# Patient Record
Sex: Female | Born: 1980 | Race: Black or African American | Hispanic: No | Marital: Married | Smoking: Never smoker
Health system: Southern US, Community
[De-identification: ages and names within clinical notes are randomized; demographics above are authoritative.]

## PROBLEM LIST (undated history)

## (undated) DIAGNOSIS — F99 Mental disorder, not otherwise specified: Secondary | ICD-10-CM

## (undated) HISTORY — DX: Mental disorder, not otherwise specified: F99

---
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
## 2010-05-05 ENCOUNTER — Ambulatory Visit: Payer: Self-pay | Admitting: Internal Medicine

## 2010-05-05 DIAGNOSIS — R209 Unspecified disturbances of skin sensation: Secondary | ICD-10-CM

## 2010-05-07 LAB — CONVERTED CEMR LAB
AST: 27 units/L (ref 0–37)
Albumin: 4.2 g/dL (ref 3.5–5.2)
Alkaline Phosphatase: 54 units/L (ref 39–117)
Basophils Relative: 0.3 % (ref 0.0–3.0)
Calcium: 9.6 mg/dL (ref 8.4–10.5)
Folate: 14.4 ng/mL
GFR calc non Af Amer: 213.37 mL/min (ref >60)
HDL: 48.2 mg/dL (ref >39.00)
Hemoglobin, Urine: NEGATIVE
Hemoglobin: 14.3 g/dL (ref 12.0–15.0)
Ketones, ur: NEGATIVE mg/dL
Lymphocytes Relative: 50.4 % — ABNORMAL HIGH (ref 12.0–46.0)
Monocytes Relative: 8 % (ref 3.0–12.0)
Neutro Abs: 2.5 10*3/uL (ref 1.4–7.7)
RBC: 4.58 M/uL (ref 3.87–5.11)
Sodium: 140 meq/L (ref 135–145)
Specific Gravity, Urine: 1.02 (ref 1.000–1.030)
Total CHOL/HDL Ratio: 3
Total Protein: 7.6 g/dL (ref 6.0–8.3)
Urine Glucose: NEGATIVE mg/dL
Urobilinogen, UA: 0.2 (ref 0.0–1.0)
VLDL: 8.8 mg/dL (ref 0.0–40.0)
Vitamin B-12: 428 pg/mL (ref 211–911)

## 2010-05-17 ENCOUNTER — Encounter: Payer: Self-pay | Admitting: Internal Medicine
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
## 2012-05-24 ENCOUNTER — Emergency Department (HOSPITAL_COMMUNITY)
Admission: EM | Admit: 2012-05-24 | Discharge: 2012-05-24 | Disposition: A | Discharge status: Home / Self Care | Payer: Federal, State, Local not specified - PPO | Attending: Emergency Medicine | Admitting: Emergency Medicine

## 2012-05-24 ENCOUNTER — Emergency Department (HOSPITAL_COMMUNITY): Payer: Federal, State, Local not specified - PPO

## 2012-05-24 ENCOUNTER — Encounter (HOSPITAL_COMMUNITY): Payer: Self-pay | Admitting: Emergency Medicine

## 2012-05-24 DIAGNOSIS — R079 Chest pain, unspecified: Secondary | ICD-10-CM

## 2012-05-24 DIAGNOSIS — R000 Tachycardia, unspecified: Secondary | ICD-10-CM | POA: Insufficient documentation

## 2012-05-24 NOTE — ED Notes (Signed)
Patient states new onset of chest discomfort-comes and goes-no new meds, daily routine has not changed , does not know what's causing symptoms

## 2012-05-24 NOTE — ED Provider Notes (Signed)
History     CSN: [id number]  Arrival date & time 05/24/12  0910   First MD Initiated Contact with Patient 05/24/12 [phone]      Chief Complaint  Patient presents with  . Chest Pain    (Consider location/radiation/quality/duration/timing/severity/associated sxs/prior treatment) HPI Comments: Madison Chambers presents ambulatory for evaluation of chest pain.  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  She states the location was below the left breast radiating to the back.  She also had numbness and tingling leg.   She denies any previous similar episodes of pain.  She denies pain  Right now.  She denies SOB, cough, palpitations, leg swelling, calf pain, fever, NVD, GERD, heartburn, back pain, and trauma.  She also denies any  New stressors.  Patient is a 31 y.o. female presenting with chest pain. The history is provided by the patient. No language interpreter was used.  Chest Pain The chest pain began 6 - 12 hours ago (0500). Chest pain occurs intermittently. The chest pain is improving. At its most intense, the pain is at 7/10. The pain is currently at 0/10. The quality of the pain is described as dull and tightness. Pertinent negatives for primary symptoms include no fever, no fatigue, no syncope, no shortness of breath, no cough, no wheezing, no palpitations, no abdominal pain, no nausea, no vomiting and no altered mental status.  Pertinent negatives for associated symptoms include no claudication, no diaphoresis, no lower extremity edema, no near-syncope, no numbness, no orthopnea, no paroxysmal nocturnal dyspnea and no weakness. She tried nothing for the symptoms. Risk factors include no known risk factors.  Her family medical history is significant for hypertension in family.     History reviewed. No pertinent past medical history.  History reviewed. No pertinent past surgical history.  History reviewed. No pertinent family  history.  History  Substance Use Topics  . Smoking status: Never Smoker   . Smokeless tobacco: Not on file  . Alcohol Use: No    OB History    Grav Para Term Preterm Abortions TAB SAB Ect Mult Living                  Review of Systems  Constitutional: Negative for fever, diaphoresis and fatigue.  HENT: Negative.   Eyes: Negative.   Respiratory: Positive for chest tightness. Negative for cough, choking, shortness of breath and wheezing.   Cardiovascular: Positive for chest pain. Negative for palpitations, orthopnea, claudication, leg swelling, syncope and near-syncope.  Gastrointestinal: Negative for nausea, vomiting and abdominal pain.  Genitourinary: Negative.   Neurological: Negative for weakness and numbness.  Hematological: Negative.   Psychiatric/Behavioral: Negative.  Negative for altered mental status.    Allergies  Review of patient's allergies indicates no known allergies.  Home Medications  No current outpatient prescriptions on file.  BP 129/88  Pulse 110  Temp 98.3 F (36.8 C) (Oral)  Resp 13  Ht 5\' 2"  (1.575 m)  Wt 128 lb (58.06 kg)  BMI 23.41 kg/m2  SpO2 100%  LMP 05/04/2012  Physical Exam  Constitutional: She is oriented to person, place, and time. She appears well-developed and well-nourished. No distress. She is not intubated.  HENT:  Head: Normocephalic and atraumatic.  Right Ear: External ear normal.  Left Ear: External ear normal.  Nose: Nose normal.  Mouth/Throat: Oropharynx is clear and moist. No oropharyngeal exudate.  Eyes: Conjunctivae normal and EOM are normal. Pupils are equal, round,  and reactive to light. Right eye exhibits no discharge. Left eye exhibits discharge. No scleral icterus.  Neck: Normal range of motion. Neck supple. No JVD present. No tracheal deviation present. No thyromegaly present.  Cardiovascular: Regular rhythm, S1 normal, S2 normal, normal heart sounds, intact distal pulses and normal pulses.   No extrasystoles  are present. Tachycardia present.  PMI is not displaced.  Exam reveals no gallop and no decreased pulses.   No murmur heard. Pulmonary/Chest: Effort normal and breath sounds normal. No accessory muscle usage or stridor. No apnea, not tachypneic and not bradypneic. She is not intubated. No respiratory distress. She has no decreased breath sounds. She has no wheezes. She has no rales. She exhibits no tenderness.  Abdominal: Soft. Bowel sounds are normal. She exhibits no shifting dullness, no distension, no ascites, no pulsatile midline mass and no mass. There is no tenderness. There is no rebound and no guarding.  Musculoskeletal: Normal range of motion. She exhibits no edema and no tenderness.  Lymphadenopathy:    She has no cervical adenopathy.  Neurological: She is alert and oriented to person, place, and time. No cranial nerve deficit.  Skin: Skin is warm and dry. No rash noted. She is not diaphoretic. No erythema. No pallor.  Psychiatric: She has a normal mood and affect. Her behavior is normal.    ED Course  Procedures (including critical care time)   Labs Reviewed  D-DIMER, QUANTITATIVE   No results found.   No diagnosis found.   Date: 05/24/2012  Rate: 111  Rhythm: sinus tachycardia  QRS Axis: normal  Intervals: normal  ST/T Wave abnormalities: normal  Conduction Disutrbances:none  Narrative Interpretation: sinus tachycardia without evidence of acute ischemia  Old EKG Reviewed: none available      MDM  Pt presents for evaluation of chest pain.  She is currently pain free.  Note mildly elevated BP with some tachycardia., NAD.  Pt denies risk factors for early heart disease, tobacco, alcohol, or substance abuse, OCP use or hormone therapy, and recent travel or immobilization.  Plan routine, age-appropriate chest pain screening including u-preg, EKG, CXR, and ddimer.  Sill review results as available.  1300.  Pt stable, NAD.  She is currently pain free. Tachycardia has  resolved.  Repeat EKG performed at 1249 demonstrates a sinus rhythm at 78 bpm with nl axis and intervals.  No evidence of acute ischemia observed.  D-dimer is not elevated and pt is at low risk for thromboembolic event by Well's Criteria.  CXR is clear without cardiomegaly, effusion, ptx, or infiltrate.  Plan discharge home to f/u closely with her PMD.  Will also refer to a local cardiologist.     Tobin Chad, MD 05/24/12 1308

## 2012-05-24 NOTE — ED Notes (Signed)
Pt reports sharp chest pain just below left breast, radiating into back.  Pt denies N/V, SOB, or additional symptoms.  Pt states pain began around 5am.  Pt also reports pain to left calf.  Pt denies smoking or birth control use.

## 2012-10-25 ENCOUNTER — Other Ambulatory Visit: Payer: Self-pay | Admitting: Obstetrics and Gynecology

## 2012-10-25 DIAGNOSIS — Z803 Family history of malignant neoplasm of breast: Secondary | ICD-10-CM

## 2012-10-25 DIAGNOSIS — Z1231 Encounter for screening mammogram for malignant neoplasm of breast: Secondary | ICD-10-CM

## 2012-12-10 ENCOUNTER — Ambulatory Visit
Admission: RE | Admit: 2012-12-10 | Discharge: 2012-12-10 | Disposition: A | Discharge status: Home / Self Care | Payer: Federal, State, Local not specified - PPO | Source: Ambulatory Visit | Attending: Obstetrics and Gynecology | Admitting: Obstetrics and Gynecology

## 2012-12-10 DIAGNOSIS — Z1231 Encounter for screening mammogram for malignant neoplasm of breast: Secondary | ICD-10-CM

## 2012-12-10 DIAGNOSIS — Z803 Family history of malignant neoplasm of breast: Secondary | ICD-10-CM

## 2013-03-08 ENCOUNTER — Emergency Department (INDEPENDENT_AMBULATORY_CARE_PROVIDER_SITE_OTHER)
Admission: EM | Admit: 2013-03-08 | Discharge: 2013-03-08 | Disposition: A | Discharge status: Home / Self Care | Payer: Self-pay | Source: Home / Self Care | Attending: Emergency Medicine | Admitting: Emergency Medicine

## 2013-03-08 ENCOUNTER — Encounter (HOSPITAL_COMMUNITY): Payer: Self-pay | Admitting: *Deleted

## 2013-03-08 DIAGNOSIS — S40811A Abrasion of right upper arm, initial encounter: Secondary | ICD-10-CM

## 2013-03-08 DIAGNOSIS — IMO0002 Reserved for concepts with insufficient information to code with codable children: Secondary | ICD-10-CM
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
## 2013-03-08 NOTE — ED Provider Notes (Signed)
Medical screening examination/treatment/procedure(s) were performed by non-physician practitioner and as supervising physician I was immediately available for consultation/collaboration.  Raynald Blend, MD 03/08/13 1413
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
## 2013-03-09 ENCOUNTER — Encounter (HOSPITAL_COMMUNITY): Payer: Self-pay | Admitting: Family Medicine

## 2013-03-09 ENCOUNTER — Emergency Department (HOSPITAL_COMMUNITY)
Admission: EM | Admit: 2013-03-09 | Discharge: 2013-03-09 | Disposition: A | Discharge status: Home / Self Care | Payer: No Typology Code available for payment source | Attending: Emergency Medicine | Admitting: Emergency Medicine

## 2013-03-09 ENCOUNTER — Emergency Department (HOSPITAL_COMMUNITY): Payer: No Typology Code available for payment source

## 2013-03-09 DIAGNOSIS — Z91040 Latex allergy status: Secondary | ICD-10-CM | POA: Insufficient documentation

## 2013-03-09 DIAGNOSIS — Y9389 Activity, other specified: Secondary | ICD-10-CM | POA: Insufficient documentation

## 2013-03-09 DIAGNOSIS — Y92410 Unspecified street and highway as the place of occurrence of the external cause: Secondary | ICD-10-CM | POA: Insufficient documentation

## 2013-03-09 DIAGNOSIS — S139XXA Sprain of joints and ligaments of unspecified parts of neck, initial encounter: Secondary | ICD-10-CM | POA: Insufficient documentation

## 2013-03-09 DIAGNOSIS — Z79899 Other long term (current) drug therapy: Secondary | ICD-10-CM | POA: Insufficient documentation

## 2013-03-09 DIAGNOSIS — S161XXA Strain of muscle, fascia and tendon at neck level, initial encounter: Secondary | ICD-10-CM

## 2013-03-09 MED ORDER — HYDROCODONE-ACETAMINOPHEN 5-325 MG PO TABS: 1.0000 | ORAL_TABLET | Freq: Four times a day (QID) | ORAL | Status: DC | PRN

## 2013-03-09 MED ORDER — IBUPROFEN 800 MG PO TABS: 800.0000 mg | ORAL_TABLET | Freq: Three times a day (TID) | ORAL | Status: DC | PRN

## 2013-03-09 MED ORDER — METHOCARBAMOL 750 MG PO TABS: 750.0000 mg | ORAL_TABLET | Freq: Four times a day (QID) | ORAL | Status: DC | PRN
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
## 2013-03-12 NOTE — ED Provider Notes (Signed)
  Medical screening examination/treatment/procedure(s) were performed by non-physician practitioner and as supervising physician I was immediately available for consultation/collaboration.    Gerhard Munch, MD 03/12/13 1114

## 2020-04-06 LAB — OB RESULTS CONSOLE ANTIBODY SCREEN: Antibody Screen: NEGATIVE

## 2020-04-06 LAB — OB RESULTS CONSOLE HGB/HCT, BLOOD
HCT: 37 (ref 29–41)
Hemoglobin: 12.6

## 2020-04-06 LAB — HEPATITIS C ANTIBODY: HCV Ab: NEGATIVE

## 2020-04-06 LAB — OB RESULTS CONSOLE PLATELET COUNT: Platelets: 182

## 2020-04-06 LAB — OB RESULTS CONSOLE ABO/RH: RH Type: NEGATIVE

## 2020-04-06 LAB — OB RESULTS CONSOLE RUBELLA ANTIBODY, IGM: Rubella: IMMUNE

## 2020-04-06 LAB — OB RESULTS CONSOLE RPR: RPR: NONREACTIVE

## 2020-04-06 LAB — OB RESULTS CONSOLE HEPATITIS B SURFACE ANTIGEN: Hepatitis B Surface Ag: NEGATIVE

## 2020-04-06 LAB — OB RESULTS CONSOLE VARICELLA ZOSTER ANTIBODY, IGG: Varicella: IMMUNE

## 2020-04-06 LAB — OB RESULTS CONSOLE GC/CHLAMYDIA
Chlamydia: NEGATIVE
Gonorrhea: NEGATIVE

## 2020-04-06 LAB — OB RESULTS CONSOLE HIV ANTIBODY (ROUTINE TESTING): HIV: NONREACTIVE

## 2020-06-22 ENCOUNTER — Encounter: Payer: Self-pay | Admitting: General Practice

## 2020-06-22 ENCOUNTER — Inpatient Hospital Stay (HOSPITAL_COMMUNITY): Admit: 2020-06-22 | Payer: No Typology Code available for payment source | Admitting: Obstetrics and Gynecology

## 2020-07-05 ENCOUNTER — Other Ambulatory Visit: Payer: Self-pay | Admitting: *Deleted

## 2020-07-05 ENCOUNTER — Encounter: Payer: Self-pay | Admitting: *Deleted

## 2020-07-05 DIAGNOSIS — O0990 Supervision of high risk pregnancy, unspecified, unspecified trimester: Secondary | ICD-10-CM | POA: Insufficient documentation

## 2020-07-05 DIAGNOSIS — O09529 Supervision of elderly multigravida, unspecified trimester: Secondary | ICD-10-CM

## 2020-07-05 HISTORY — DX: Supervision of elderly multigravida, unspecified trimester: O09.529

## 2020-07-05 HISTORY — DX: Supervision of high risk pregnancy, unspecified, unspecified trimester: O09.90

## 2020-07-15 ENCOUNTER — Other Ambulatory Visit: Payer: Self-pay

## 2020-07-15 ENCOUNTER — Encounter: Payer: Self-pay | Admitting: Obstetrics and Gynecology

## 2020-07-15 ENCOUNTER — Ambulatory Visit (INDEPENDENT_AMBULATORY_CARE_PROVIDER_SITE_OTHER): Payer: No Typology Code available for payment source | Admitting: Obstetrics and Gynecology

## 2020-07-15 VITALS — BP 117/76 | HR 94 | Wt 150.0 lb

## 2020-07-15 DIAGNOSIS — O0990 Supervision of high risk pregnancy, unspecified, unspecified trimester: Secondary | ICD-10-CM

## 2020-07-15 DIAGNOSIS — O09529 Supervision of elderly multigravida, unspecified trimester: Secondary | ICD-10-CM | POA: Diagnosis not present

## 2020-07-15 DIAGNOSIS — Z23 Encounter for immunization: Secondary | ICD-10-CM

## 2020-07-15 DIAGNOSIS — Z3A26 26 weeks gestation of pregnancy: Secondary | ICD-10-CM

## 2020-07-15 NOTE — Progress Notes (Signed)
INITIAL OBSTETRICAL VISIT Patient name: Madison Chambers MRN [id number]  Date of birth: 02-10-1981 Chief Complaint:   Routine Prenatal Visit  History of Present Illness:   Madison Chambers is a 39 y.o. G2P1001 African American female at [redacted]w[redacted]d by LMP with an Estimated Date of Delivery: 10/21/20 being seen today for her initial obstetrical visit. She is transferring her care from [facility] for waterbirth. Her obstetrical history is significant for advanced maternal age and Rh Negative. Her last pregnancy was uncomplicated resulting in a NSVD of 6 lbs 12 oz female (09/11/2008 at [facility] - IV pain meds only). This is an unplanned, but desired pregnancy. She and the father of the baby (FOB) "Madison Chambers" live together. She has a support system that consists of her husband/family/friends. Today she reports no complaints. She is   Patient's last menstrual period was 01/21/2020. Last pap 01/31/2019. Results were: normal Review of Systems:   Pertinent items are noted in HPI Denies cramping/contractions, leakage of fluid, vaginal bleeding, abnormal vaginal discharge w/ itching/odor/irritation, headaches, visual changes, shortness of breath, chest pain, abdominal pain, severe nausea/vomiting, or problems with urination or bowel movements unless otherwise stated above.  Pertinent History Reviewed:  Reviewed past medical,surgical, social, obstetrical and family history.  Reviewed problem list, medications and allergies. OB History  Gravida Para Term Preterm AB Living  2 1 1     1   SAB TAB Ectopic Multiple Live Births          1    # Outcome Date GA Lbr Len/2nd Weight Sex Delivery Anes PTL Lv  2 Current           1 Term 09/11/08 [redacted]w[redacted]d  6 lb 12 oz (3.062 kg) M Vag-Spont None  LIV   Physical Assessment:   Vitals:   07/15/20 1511  BP: 117/76  Pulse: 94  Weight: 150 lb (68 kg)  Body mass index is 27.44 kg/m.       Physical Examination:  General appearance - well  appearing, and in no distress  Mental status - alert, oriented to person, place, and time  Psych:  She has a normal mood and affect  Skin - warm and dry, normal color, no suspicious lesions noted  Chest - effort normal, all lung fields clear to auscultation bilaterally  Heart - normal rate and regular rhythm  Abdomen - soft, nontender  Extremities:  No swelling or varicosities noted  Pelvic - deferred  Thin prep pap is not done    FHTs by doppler: 149 bpm  Assessment & Plan:  1) High-Risk Pregnancy G2P1001 at [redacted]w[redacted]d with an Estimated Date of Delivery: 10/21/20   2) Initial OB visit - Welcomed to practice and introduced self to patient in addition to discussing other advanced practice providers that she may be seeing at this practice - Congratulated patient - Anticipatory guidance on upcoming appointments - Educated on COVID19 and pregnancy and the integration of virtual appointments  - Educated on babyscripts app- patient reports she has not received email, encouraged to look in spam folder and to call office if she still has not received email - patient verbalizes understanding    3) Supervision of high risk pregnancy, antepartum - Tdap vaccine greater than or equal to 7yo IM - Discussed need for Rhogam at 28 wks, plan to offer TdaP and flu vaccine  4) Antepartum multigravida of advanced maternal age - Antenatal testing according to MFM guidelines  5) [redacted] weeks gestation of pregnancy   Meds: No  orders of the defined types were placed in this encounter.   Initial labs reviewed from prior OB office records Continue prenatal vitamins Reviewed n/v relief measures and warning s/s to report Reviewed recommended weight gain based on pre-gravid BMI Encouraged well-balanced diet Genetic Screening discussed: results reviewed Cystic fibrosis, SMA, Fragile X screening discussed results reviewed The nature of [facility] with multiple MDs and other  Advanced Practice Providers was explained to patient; also emphasized that residents, students are part of our team.  Discussed optimized OB schedule and video visits. Advised can have an in-office visit whenever she feels she needs to be seen.  Does not have own BP cuff. Check BP weekly, let us know if >140/90. Advised to call during normal business hours and there is an after-hours nurse line available.    Follow-up: Return in about 3 weeks (around 08/05/2020) for Return OB 2hr GTT.   Orders Placed This Encounter  Procedures  . Tdap vaccine greater than or equal to 7yo IM  . OB RESULTS CONSOLE GC/Chlamydia  . OB RESULTS CONSOLE RPR  . OB RESULTS CONSOLE HIV antibody  . OB RESULTS CONSOLE Rubella Antibody  . OB RESULTS CONSOLE Varicella zoster antibody, IgG  . OB RESULTS CONSOLE Hepatitis B surface antigen  . OB RESULTS CONSOLE Hemoglobin and hematocrit, blood  . OB RESULTS CONSOLE PLATELET COUNT  . Hepatitis C antibody  . OB RESULTS CONSOLE ABO/Rh  . OB RESULTS CONSOLE Antibody Screen    Raelyn Mora MSN, CNM 07/16/2020

## 2020-07-15 NOTE — Patient Instructions (Signed)
Glucose Tolerance Test During Pregnancy Why am I having this test? The glucose tolerance test (GTT) is done to check how your body processes sugar (glucose). This is one of several tests used to diagnose diabetes that develops during pregnancy (gestational diabetes mellitus). Gestational diabetes is a temporary form of diabetes that some women develop during pregnancy. It usually occurs during the second trimester of pregnancy and goes away after delivery. Testing (screening) for gestational diabetes usually occurs between 24 and 28 weeks of pregnancy. You may have the GTT test after having a 1-hour glucose screening test if the results from that test indicate that you may have gestational diabetes. You may also have this test if:  You have a history of gestational diabetes.  You have a history of giving birth to very large babies or have experienced repeated fetal loss (stillbirth).  You have signs and symptoms of diabetes, such as: ? Changes in your vision. ? Tingling or numbness in your hands or feet. ? Changes in hunger, thirst, and urination that are not otherwise explained by your pregnancy. What is being tested? This test measures the amount of glucose in your blood at different times during a period of 3 hours. This indicates how well your body is able to process glucose. What kind of sample is taken?  Blood samples are required for this test. They are usually collected by inserting a needle into a blood vessel. How do I prepare for this test?  For 3 days before your test, eat normally. Have plenty of carbohydrate-rich foods.  Follow instructions from your health care provider about: ? Eating or drinking restrictions on the day of the test. You may be asked to not eat or drink anything other than water (fast) starting 8-10 hours before the test. ? Changing or stopping your regular medicines. Some medicines may interfere with this test. Tell a health care provider about:  All  medicines you are taking, including vitamins, herbs, eye drops, creams, and over-the-counter medicines.  Any blood disorders you have.  Any surgeries you have had.  Any medical conditions you have. What happens during the test? First, your blood glucose will be measured. This is referred to as your fasting blood glucose, since you fasted before the test. Then, you will drink a glucose solution that contains a certain amount of glucose. Your blood glucose will be measured again 1, 2, and 3 hours after drinking the solution. This test takes about 3 hours to complete. You will need to stay at the testing location during this time. During the testing period:  Do not eat or drink anything other than the glucose solution.  Do not exercise.  Do not use any products that contain nicotine or tobacco, such as cigarettes and e-cigarettes. If you need help stopping, ask your health care provider. The testing procedure may vary among health care providers and hospitals. How are the results reported? Your results will be reported as milligrams of glucose per deciliter of blood (mg/dL) or millimoles per liter (mmol/L). Your health care provider will compare your results to normal ranges that were established after testing a large group of people (reference ranges). Reference ranges may vary among labs and hospitals. For this test, common reference ranges are:  Fasting: less than 95-105 mg/dL (5.3-5.8 mmol/L).  1 hour after drinking glucose: less than 180-190 mg/dL (10.0-10.5 mmol/L).  2 hours after drinking glucose: less than 155-165 mg/dL (8.6-9.2 mmol/L).  3 hours after drinking glucose: 140-145 mg/dL (7.8-8.1 mmol/L). What do the   results mean? Results within reference ranges are considered normal, meaning that your glucose levels are well-controlled. If two or more of your blood glucose levels are high, you may be diagnosed with gestational diabetes. If only one level is high, your health care  provider may suggest repeat testing or other tests to confirm a diagnosis. Talk with your health care provider about what your results mean. Questions to ask your health care provider Ask your health care provider, or the department that is doing the test:  When will my results be ready?  How will I get my results?  What are my treatment options?  What other tests do I need?  What are my next steps? Summary  The glucose tolerance test (GTT) is one of several tests used to diagnose diabetes that develops during pregnancy (gestational diabetes mellitus). Gestational diabetes is a temporary form of diabetes that some women develop during pregnancy.  You may have the GTT test after having a 1-hour glucose screening test if the results from that test indicate that you may have gestational diabetes. You may also have this test if you have any symptoms or risk factors for gestational diabetes.  Talk with your health care provider about what your results mean. This information is not intended to replace advice given to you by your health care provider. Make sure you discuss any questions you have with your health care provider. Document Revised: 12/05/2018 Document Reviewed: 03/26/2017 Elsevier Patient Education  2020 [employer]vier Inc[employer]  

## 2020-07-16 ENCOUNTER — Encounter: Payer: Self-pay | Admitting: Obstetrics and Gynecology

## 2020-08-04 ENCOUNTER — Other Ambulatory Visit: Payer: Self-pay

## 2020-08-04 ENCOUNTER — Ambulatory Visit (INDEPENDENT_AMBULATORY_CARE_PROVIDER_SITE_OTHER): Payer: No Typology Code available for payment source

## 2020-08-04 VITALS — BP 119/69 | HR 102 | Temp 97.9°F | Wt 152.6 lb

## 2020-08-04 DIAGNOSIS — Z2913 Encounter for prophylactic Rho(D) immune globulin: Secondary | ICD-10-CM | POA: Diagnosis not present

## 2020-08-04 DIAGNOSIS — O0990 Supervision of high risk pregnancy, unspecified, unspecified trimester: Secondary | ICD-10-CM | POA: Diagnosis not present

## 2020-08-04 DIAGNOSIS — Z3A28 28 weeks gestation of pregnancy: Secondary | ICD-10-CM

## 2020-08-04 MED ORDER — RHO D IMMUNE GLOBULIN 1500 UNIT/2ML IJ SOSY
300.0000 ug | PREFILLED_SYRINGE | Freq: Once | INTRAMUSCULAR | Status: AC
  Administered 2020-08-04: 300 ug via INTRAMUSCULAR

## 2020-08-04 NOTE — Patient Instructions (Signed)
Glucose Tolerance Test During Pregnancy Why am I having this test? The glucose tolerance test (GTT) is done to check how your body processes sugar (glucose). This is one of several tests used to diagnose diabetes that develops during pregnancy (gestational diabetes mellitus). Gestational diabetes is a temporary form of diabetes that some women develop during pregnancy. It usually occurs during the second trimester of pregnancy and goes away after delivery. Testing (screening) for gestational diabetes usually occurs between 24 and 28 weeks of pregnancy. You may have the GTT test after having a 1-hour glucose screening test if the results from that test indicate that you may have gestational diabetes. You may also have this test if:  You have a history of gestational diabetes.  You have a history of giving birth to very large babies or have experienced repeated fetal loss (stillbirth).  You have signs and symptoms of diabetes, such as: ? Changes in your vision. ? Tingling or numbness in your hands or feet. ? Changes in hunger, thirst, and urination that are not otherwise explained by your pregnancy. What is being tested? This test measures the amount of glucose in your blood at different times during a period of 3 hours. This indicates how well your body is able to process glucose. What kind of sample is taken?  Blood samples are required for this test. They are usually collected by inserting a needle into a blood vessel. How do I prepare for this test?  For 3 days before your test, eat normally. Have plenty of carbohydrate-rich foods.  Follow instructions from your health care provider about: ? Eating or drinking restrictions on the day of the test. You may be asked to not eat or drink anything other than water (fast) starting 8-10 hours before the test. ? Changing or stopping your regular medicines. Some medicines may interfere with this test. Tell a health care provider about:  All  medicines you are taking, including vitamins, herbs, eye drops, creams, and over-the-counter medicines.  Any blood disorders you have.  Any surgeries you have had.  Any medical conditions you have. What happens during the test? First, your blood glucose will be measured. This is referred to as your fasting blood glucose, since you fasted before the test. Then, you will drink a glucose solution that contains a certain amount of glucose. Your blood glucose will be measured again 1, 2, and 3 hours after drinking the solution. This test takes about 3 hours to complete. You will need to stay at the testing location during this time. During the testing period:  Do not eat or drink anything other than the glucose solution.  Do not exercise.  Do not use any products that contain nicotine or tobacco, such as cigarettes and e-cigarettes. If you need help stopping, ask your health care provider. The testing procedure may vary among health care providers and hospitals. How are the results reported? Your results will be reported as milligrams of glucose per deciliter of blood (mg/dL) or millimoles per liter (mmol/L). Your health care provider will compare your results to normal ranges that were established after testing a large group of people (reference ranges). Reference ranges may vary among labs and hospitals. For this test, common reference ranges are:  Fasting: less than 95-105 mg/dL (5.3-5.8 mmol/L).  1 hour after drinking glucose: less than 180-190 mg/dL (10.0-10.5 mmol/L).  2 hours after drinking glucose: less than 155-165 mg/dL (8.6-9.2 mmol/L).  3 hours after drinking glucose: 140-145 mg/dL (7.8-8.1 mmol/L). What do the   results mean? Results within reference ranges are considered normal, meaning that your glucose levels are well-controlled. If two or more of your blood glucose levels are high, you may be diagnosed with gestational diabetes. If only one level is high, your health care  provider may suggest repeat testing or other tests to confirm a diagnosis. Talk with your health care provider about what your results mean. Questions to ask your health care provider Ask your health care provider, or the department that is doing the test:  When will my results be ready?  How will I get my results?  What are my treatment options?  What other tests do I need?  What are my next steps? Summary  The glucose tolerance test (GTT) is one of several tests used to diagnose diabetes that develops during pregnancy (gestational diabetes mellitus). Gestational diabetes is a temporary form of diabetes that some women develop during pregnancy.  You may have the GTT test after having a 1-hour glucose screening test if the results from that test indicate that you may have gestational diabetes. You may also have this test if you have any symptoms or risk factors for gestational diabetes.  Talk with your health care provider about what your results mean. This information is not intended to replace advice given to you by your health care provider. Make sure you discuss any questions you have with your health care provider. Document Revised: 12/05/2018 Document Reviewed: 03/26/2017 Elsevier Patient Education  2020 [employer]vier Inc[employer]  

## 2020-08-04 NOTE — Progress Notes (Signed)
   HIGH-RISK PREGNANCY OFFICE VISIT  Patient name: Madison Chambers MRN [id number]  Date of birth: [birth year] Chief Complaint:   Routine Prenatal Visit  Subjective:   Madison Chambers is a 39 y.o. G2P1001 female at [redacted]w[redacted]d with an Estimated Date of Delivery: 10/21/20 being seen today for ongoing management of a high-risk pregnancy aeb has NUMBNESS, HAND; Supervision of high risk pregnancy, antepartum; and Antepartum multigravida of advanced maternal age on their problem list.  Patient presents today without complaints. Patient endorses fetal movement.  She denies abdominal pain, contractions, and vaginal concerns including abnormal discharge, leaking of fluid, and bleeding.  Contractions: Not present. Vag. Bleeding: None.  Movement: Present.  Reviewed past medical,surgical, social, obstetrical and family history as well as problem list, medications and allergies.  Objective   Vitals:   08/04/20 0831  BP: 119/69  Pulse: (!) 102  Temp: 97.9 F (36.6 C)  Weight: 152 lb 9.6 oz (69.2 kg)  Body mass index is 27.91 kg/m.  Total Weight Gain:27 lb 9.6 oz (12.5 kg)         Physical Examination:   General appearance: Well appearing, and in no distress  Mental status: Alert, oriented to person, place, and time  Skin: Warm & dry  Cardiovascular: Normal heart rate noted  Respiratory: Normal respiratory effort, no distress  Abdomen: Soft, gravid, nontender, AGA with Fundal height of Fundal Height: 26 cm  Pelvic: Cervical exam deferred           Extremities: Edema: None  Fetal Status: Fetal Heart Rate (bpm): 141  Movement: Present   No results found for this or any previous visit (from the past 24 hour(s)).  Assessment & Plan:  HIGH-risk pregnancy of a 39 y.o., G2P1001 at [redacted]w[redacted]d with an Estimated Date of Delivery: 10/21/20   1. Supervision of high risk pregnancy, antepartum -Anticipatory guidance for upcoming appts. -Reviewed glucola today and discussed how results of GTT are handled  including diabetic education. -BS testing for abnormal results and routine care for normal results.   2. Need for rhogam due to Rh negative mother -O Negative -Will give rhogam today  3. [redacted] weeks gestation of pregnancy -Doing well. -Desires WB. -Reports took class and instructed to send certificate via mychart. -Also taking breastfeeding course. -Reviewed fundal height. Reassured that okay to be +/- 2 FB, but anymore would require further evaluation.    Meds:  Meds ordered this encounter  Medications  . rho (d) immune globulin (RHIG/RHOPHYLAC) injection 300 mcg   Labs/procedures today:  Lab Orders     Glucose Tolerance, 2 Hours w/1 Hour     HIV Antibody (routine testing w rflx)     RPR     CBC   Reviewed: Preterm labor symptoms and general obstetric precautions including but not limited to vaginal bleeding, contractions, leaking of fluid and fetal movement were reviewed in detail with the patient.  All questions were answered.  Follow-up: Return in about 2 weeks (around 08/18/2020) for HROB.  Orders Placed This Encounter  Procedures  . Glucose Tolerance, 2 Hours w/1 Hour  . HIV Antibody (routine testing w rflx)  . RPR  . CBC   Cherre Robins MSN, CNM 08/04/2020

## 2020-08-05 ENCOUNTER — Encounter: Payer: Self-pay | Admitting: General Practice

## 2020-08-05 LAB — CBC
Hematocrit: 31.3 % — ABNORMAL LOW (ref 34.0–46.6)
Hemoglobin: 11.2 g/dL (ref 11.1–15.9)
MCH: 32 pg (ref 26.6–33.0)
MCHC: 35.8 g/dL — ABNORMAL HIGH (ref 31.5–35.7)
MCV: 89 fL (ref 79–97)
Platelets: 170 10*3/uL (ref 150–450)
RBC: 3.5 x10E6/uL — ABNORMAL LOW (ref 3.77–5.28)
RDW: 11.8 % (ref 11.7–15.4)
WBC: 7.2 10*3/uL (ref 3.4–10.8)

## 2020-08-05 LAB — RPR: RPR Ser Ql: NONREACTIVE

## 2020-08-05 LAB — GLUCOSE TOLERANCE, 2 HOURS W/ 1HR
Glucose, 1 hour: 164 mg/dL (ref 65–179)
Glucose, 2 hour: 143 mg/dL (ref 65–152)
Glucose, Fasting: 89 mg/dL (ref 65–91)

## 2020-08-05 LAB — HIV ANTIBODY (ROUTINE TESTING W REFLEX): HIV Screen 4th Generation wRfx: NONREACTIVE

## 2020-08-18 ENCOUNTER — Ambulatory Visit (INDEPENDENT_AMBULATORY_CARE_PROVIDER_SITE_OTHER): Payer: No Typology Code available for payment source | Admitting: Obstetrics and Gynecology

## 2020-08-18 ENCOUNTER — Other Ambulatory Visit: Payer: Self-pay

## 2020-08-18 VITALS — BP 111/71 | HR 94 | Temp 97.7°F | Wt 155.2 lb

## 2020-08-18 DIAGNOSIS — O0990 Supervision of high risk pregnancy, unspecified, unspecified trimester: Secondary | ICD-10-CM

## 2020-08-18 DIAGNOSIS — O09529 Supervision of elderly multigravida, unspecified trimester: Secondary | ICD-10-CM

## 2020-08-18 DIAGNOSIS — Z3A30 30 weeks gestation of pregnancy: Secondary | ICD-10-CM

## 2020-08-18 DIAGNOSIS — R55 Syncope and collapse: Secondary | ICD-10-CM

## 2020-08-18 NOTE — Patient Instructions (Signed)
Protein Content in Foods  Generally, most healthy people need around 50 grams of protein each day. Depending on your overall health, you may need more or less protein in your diet. Talk to your health care provider or dietitian about how much protein you need. See the following list for the protein content of some common foods. High-protein foods High-protein foods contain 4 grams (4 g) or more of protein per serving. They include:  Beef, ground sirloin (cooked) -- 3 oz have 24 g of protein.  Cheese (hard) -- 1 oz has 7 g of protein.  Chicken breast, boneless and skinless (cooked) -- 3 oz have 13.4 g of protein.  Cottage cheese -- 1/2 cup has 13.4 g of protein.  Egg -- 1 egg has 6 g of protein.  Fish, filet (cooked) -- 1 oz has 6-7 g of protein.  Garbanzo beans (canned or cooked) -- 1/2 cup has 6-7 g of protein.  Kidney beans (canned or cooked) -- 1/2 cup has 6-7 g of protein.  Lamb (cooked) -- 3 oz has 24 g of protein.  Milk -- 1 cup (8 oz) has 8 g of protein.  Nuts (peanuts, pistachios, almonds) -- 1 oz has 6 g of protein.  Peanut butter -- 1 oz has 7-8 g of protein.  Pork tenderloin (cooked) -- 3 oz has 18.4 g of protein.  Pumpkin seeds -- 1 oz has 8.5 g of protein.  Soybeans (roasted) -- 1 oz has 8 g of protein.  Soybeans (cooked) -- 1/2 cup has 11 g of protein.  Soy milk -- 1 cup (8 oz) has 5-10 g of protein.  Soy or vegetable patty -- 1 patty has 11 g of protein.  Sunflower seeds -- 1 oz has 5.5 g of protein.  Tofu (firm) -- 1/2 cup has 20 g of protein.  Tuna (canned in water) -- 3 oz has 20 g of protein.  Yogurt -- 6 oz has 8 g of protein. Low-protein foods Low-protein foods contain 3 grams (3 g) or less of protein per serving. They include:  Beets (raw or cooked) -- 1/2 cup has 1.5 g of protein.  Bran cereal -- 1/2 cup has 2-3 g of protein.  Bread -- 1 slice has 2.5 g of protein.  Broccoli (raw or cooked) -- 1/2 cup has 2 g of protein.  Collard  greens (raw or cooked) -- 1/2 cup has 2 g of protein.  Corn (fresh or cooked) -- 1/2 cup has 2 g of protein.  Cream cheese -- 1 oz has 2 g of protein.  Creamer (half-and-half) -- 1 oz has 1 g of protein.  Flour tortilla -- 1 tortilla has 2.5 g of protein  Frozen yogurt -- 1/2 cup has 3 g of protein.  Fruit or vegetable juice -- 1/2 cup has 1 g of protein.  Green beans (raw or cooked) -- 1/2 cup has 1 g of protein.  Green peas (canned) -- 1/2 cup has 3.5 g of protein.  Muffins -- 1 small muffin (2 oz) has 3 g of protein.  Oatmeal (cooked) -- 1/2 cup has 3 g of protein.  Potato (baked with skin) -- 1 medium potato has 3 g of protein.  Rice (cooked) -- 1/2 cup has 2.5-3.5 g of protein.  Sour cream -- 1/2 cup has 2.5 g of protein.  Spinach (cooked) -- 1/2 cup has 3 g of protein.  Squash (cooked) -- 1/2 cup has 1.5 g of protein. Actual amounts of protein may   be different depending on processing. Talk with your health care provider or dietitian about what foods are recommended for you. This information is not intended to replace advice given to you by your health care provider. Make sure you discuss any questions you have with your health care provider. Document Revised: 04/24/2016 Document Reviewed: 04/24/2016 Elsevier Patient Education  2020 Elsevier Inc. High-Protein and High-Calorie Diet Eating high-protein and high-calorie foods can help you to gain weight, heal after an injury, and recover after an illness or surgery. The specific amount of daily protein and calories you need depends on:  Your body weight.  The reason this diet is recommended for you. What is my plan? Generally, a high-protein, high-calorie diet involves:  Eating 250-500 extra calories each day.  Making sure that you get enough of your daily calories from protein. Ask your health care provider how many of your calories should come from protein. Talk with a health care provider, such as a diet and  nutrition specialist (dietitian), about how much protein and how many calories you need each day. Follow the diet as directed by your health care provider. What are tips for following this plan?  Preparing meals  Add whole milk, half-and-half, or heavy cream to cereal, pudding, soup, or hot cocoa.  Add whole milk to instant breakfast drinks.  Add peanut butter to oatmeal or smoothies.  Add powdered milk to baked goods, smoothies, or milkshakes.  Add powdered milk, cream, or butter to mashed potatoes.  Add cheese to cooked vegetables.  Make whole-milk yogurt parfaits. Top them with granola, fruit, or nuts.  Add cottage cheese to your fruit.  Add avocado, cheese, or both to sandwiches or salads.  Add meat, poultry, or seafood to rice, pasta, casseroles, salads, and soups.  Use mayonnaise when making egg salad, chicken salad, or tuna salad.  Use peanut butter as a dip for vegetables or as a topping for pretzels, celery, or crackers.  Add beans to casseroles, dips, and spreads.  Add pureed beans to sauces and soups.  Replace calorie-free drinks with calorie-containing drinks, such as milk and fruit juice.  Replace water with milk or heavy cream when making foods such as oatmeal, pudding, or cocoa. General instructions  Ask your health care provider if you should take a nutritional supplement.  Try to eat six small meals each day instead of three large meals.  Eat a balanced diet. In each meal, include one food that is high in protein.  Keep nutritious snacks available, such as nuts, trail mixes, dried fruit, and yogurt.  If you have kidney disease or diabetes, talk with your health care provider about how much protein is safe for you. Too much protein may put extra stress on your kidneys.  Drink your calories. Choose high-calorie drinks and have them after your meals. What high-protein foods should I eat?  Vegetables Soybeans. Peas. Grains Quinoa. Bulgur  wheat. Meats and other proteins Beef, pork, and poultry. Fish and seafood. Eggs. Tofu. Textured vegetable protein (TVP). Peanut butter. Nuts and seeds. Dried beans. Protein powders. Dairy Whole milk. Whole-milk yogurt. Powdered milk. Cheese. [employer]. Eggnog. Beverages High-protein supplement drinks. Soy milk. Other foods Protein bars. The items listed above may not be a complete list of high-protein foods and beverages. Contact a dietitian for more options. What high-calorie foods should I eat? Fruits Dried fruit. Fruit leather. Canned fruit in syrup. Fruit juice. Avocado. Vegetables Vegetables cooked in oil or butter. Fried potatoes. Grains Pasta. Quick breads. Muffins. Pancakes. Ready-to-eat  cereal. Meats and other proteins Peanut butter. Nuts and seeds. Dairy Heavy cream. Whipped cream. Cream cheese. Sour cream. Ice cream. Custard. Pudding. Beverages Meal-replacement beverages. Nutrition shakes. Fruit juice. Sugar-sweetened soft drinks. Seasonings and condiments Salad dressing. Mayonnaise. Alfredo sauce. Fruit preserves or jelly. Honey. Syrup. Sweets and desserts Cake. Cookies. Pie. Pastries. Candy bars. Chocolate. Fats and oils Butter or margarine. Oil. Gravy. Other foods Meal-replacement bars. The items listed above may not be a complete list of high-calorie foods and beverages. Contact a dietitian for more options. Summary  A high-protein, high-calorie diet can help you gain weight or heal faster after an injury, illness, or surgery.  To increase your protein and calories, add ingredients such as whole milk, peanut butter, cheese, beans, meat, or seafood to meal items.  To get enough extra calories each day, include high-calorie foods and beverages at each meal.  Adding a high-calorie drink or shake can be an easy way to help you get enough calories each day. Talk with your healthcare provider or dietitian about the best options for you. This information is not  intended to replace advice given to you by your health care provider. Make sure you discuss any questions you have with your health care provider. Document Revised: 07/27/2017 Document Reviewed: 06/26/2017 Elsevier Patient Education  2020 [employer].

## 2020-08-18 NOTE — Progress Notes (Signed)
   PRENATAL VISIT NOTE  Subjective:  Madison Chambers is a 39 y.o. G2P1001 at [redacted]w[redacted]d being seen today for ongoing prenatal care.  She is currently monitored for the following issues for this high-risk pregnancy and has NUMBNESS, HAND; Supervision of high risk pregnancy, antepartum; and Antepartum multigravida of advanced maternal age on their problem list.  Patient reports no bleeding, no contractions, no cramping, no leaking and near syncopal episode once a week, resolved by sitting or lying down. .  Contractions: Not present. Vag. Bleeding: None.  Movement: Present. Denies leaking of fluid.   The following portions of the patient's history were reviewed and updated as appropriate: allergies, current medications, past family history, past medical history, past social history, past surgical history and problem list.   Objective:   Vitals:   08/18/20 1415  BP: 111/71  Pulse: 94  Temp: 97.7 F (36.5 C)  Weight: 70.4 kg    Fetal Status: Fetal Heart Rate (bpm): 145 Fundal Height: 31 cm Movement: Present     General:  Alert, oriented and cooperative. Patient is in no acute distress.  Skin: Skin is warm and dry. No rash noted.   Cardiovascular: Normal heart rate noted  Respiratory: Normal respiratory effort, no problems with respiration noted  Abdomen: Soft, gravid, appropriate for gestational age.  Pain/Pressure: Absent     Pelvic: Cervical exam deferred        Extremities: Normal range of motion.  Edema: None  Mental Status: Normal mood and affect. Normal behavior. Normal judgment and thought content.   Assessment and Plan:  Pregnancy: G2P1001 at [redacted]w[redacted]d  1. Supervision of high risk pregnancy, antepartum   2. Antepartum multigravida of advanced maternal age   37. [redacted] weeks gestation of pregnancy   4. Near syncope -Encouraged patient to increase water intake - Take frequent breaks at work  Preterm labor symptoms and general obstetric precautions including but not limited to  vaginal bleeding, contractions, leaking of fluid and fetal movement were reviewed in detail with the patient. Please refer to After Visit Summary for other counseling recommendations.   Return in about 5 weeks (around 09/22/2020).  No future appointments.  Sande Rives, Student-MidWife

## 2020-08-28 NOTE — L&D Delivery Note (Signed)
Delivery Note Pt progressed quickly through active labor, unmedicated.  After a 10  Minute 2nd stage, at 9:20 PM a viable female was delivered via Vaginal, Spontaneous (Presentation: Left Occiput Anterior).  APGAR: 8, 9; weight  Pending. fter 1 minute, the cord was clamped and cut. 40 units of pitocin diluted in 1000cc LR was infused rapidly IV.  The placenta separated spontaneously and delivered via CCT and maternal pushing effort.  It was inspected and appears to be intact with a 3 VC.    Anesthesia: Local Episiotomy: None Lacerations: 2nd degree Suture Repair: 2.0 vicryl Est. Blood Loss (mL):  253  Mom to postpartum.  Baby to Couplet care / Skin to Skin.  Jacklyn Shell 10/21/2020, 10:28 PM

## 2020-09-20 ENCOUNTER — Encounter (HOSPITAL_COMMUNITY): Payer: Self-pay | Admitting: Obstetrics and Gynecology

## 2020-09-20 ENCOUNTER — Other Ambulatory Visit: Payer: Self-pay

## 2020-09-20 ENCOUNTER — Inpatient Hospital Stay (HOSPITAL_COMMUNITY)
Admission: AD | Admit: 2020-09-20 | Discharge: 2020-09-20 | Disposition: A | Discharge status: Home / Self Care | Payer: No Typology Code available for payment source | Attending: Obstetrics and Gynecology | Admitting: Obstetrics and Gynecology

## 2020-09-20 DIAGNOSIS — O479 False labor, unspecified: Secondary | ICD-10-CM

## 2020-09-20 DIAGNOSIS — R109 Unspecified abdominal pain: Secondary | ICD-10-CM | POA: Insufficient documentation

## 2020-09-20 DIAGNOSIS — Z3A35 35 weeks gestation of pregnancy: Secondary | ICD-10-CM | POA: Insufficient documentation

## 2020-09-20 DIAGNOSIS — O09523 Supervision of elderly multigravida, third trimester: Secondary | ICD-10-CM | POA: Diagnosis not present

## 2020-09-20 DIAGNOSIS — O4703 False labor before 37 completed weeks of gestation, third trimester: Secondary | ICD-10-CM | POA: Diagnosis not present

## 2020-09-20 DIAGNOSIS — O26893 Other specified pregnancy related conditions, third trimester: Secondary | ICD-10-CM | POA: Insufficient documentation

## 2020-09-20 DIAGNOSIS — R102 Pelvic and perineal pain: Secondary | ICD-10-CM | POA: Insufficient documentation

## 2020-09-20 DIAGNOSIS — Z3689 Encounter for other specified antenatal screening: Secondary | ICD-10-CM

## 2020-09-20 DIAGNOSIS — N949 Unspecified condition associated with female genital organs and menstrual cycle: Secondary | ICD-10-CM

## 2020-09-20 NOTE — MAU Provider Note (Signed)
Event Date/Time   First Provider Initiated Contact with Patient 09/20/20 2020     S [name] is a 40 y.o. G2P1001 pregnant female at [redacted]w[redacted]d who presents to MAU today with complaint of sharp intermittent abdominal pain since 9am this morning. She has not had much to eat as her appetite has been down today, but she has been able to stay hydrated. Denies nausea or vomiting. Yesterday evening, she tripped over the dishwasher door and fell to her knees but did not hit her belly. Denies decreased fetal movement, loss of fluid, vaginal bleeding or discharge, fever or recent illness.    O BP 124/79 (BP Location: Left Arm)   Pulse 87   Temp 98.1 F (36.7 C) (Oral)   Resp 16   Ht 5\' 2"  (1.575 m)   Wt 158 lb (71.7 kg)   LMP 01/21/2020   SpO2 99%   BMI 28.90 kg/m  Physical Exam Vitals and nursing note reviewed. Exam conducted with a chaperone present.  Constitutional:      General: She is not in acute distress.    Appearance: She is not ill-appearing.  Cardiovascular:     Rate and Rhythm: Normal rate.  Pulmonary:     Effort: Pulmonary effort is normal.  Abdominal:     General: Bowel sounds are normal.     Palpations: Abdomen is soft.     Tenderness: There is no abdominal tenderness.  Genitourinary:    Vagina: Normal. No vaginal discharge or tenderness.  Neurological:     Mental Status: She is alert.    Dilation: Closed Effacement (%): 60 Cervical Position: Posterior Station: Ballotable Presentation: Vertex Exam by:: [email or web address], CNM  Fetal Tracing: reactive Baseline: 140 Variability: moderate Accelerations: present Decelerations: none Toco: irregular, q5-10 but pt not feeling them as pain, only feels mild tightness. Has only had three sharp pains since arrival to MAU.  Offered pt flexeril, she declined, preferring not to take medications and feeling much less pain than earlier in the day.   Discussed uterine irritability post falls and advised to go home, take a  warm Epsom salt bath, stretch her back and belly and see if things continue to slow overnight. Pt amenable to plan.   A False labor Round ligament pains Preterm contractions NST reactive  P Discharge from MAU in stable condition with preterm labor precautions Will follow up at [facility] as scheduled Warning signs for worsening condition that would warrant emergency follow-up discussed  [facility], CNM 09/20/2020 8:55 PM

## 2020-09-20 NOTE — Discharge Instructions (Signed)

## 2020-09-20 NOTE — MAU Note (Signed)
..  Madison Chambers is a 40 y.o. at [redacted]w[redacted]d here in MAU reporting: A fall last night around 8:30pm, where tripped over her open dishwasher and hit her leg but not her stomach. She came in today because since 9am she has had sharp lower intermittent abdominal pain. Denies vaginal bleeding or leaking of fluid. +FM.   Pain score: 8/10 Vitals:   09/20/20 1952  BP: 122/71  Pulse: 97  Resp: 16  Temp: 98.1 F (36.7 C)  SpO2: 99%     FHT: monitors applied 140's

## 2020-09-22 LAB — BILE ACIDS, TOTAL: Bile Acids Total: 3.2 umol/L (ref 0.0–10.0)

## 2020-09-23 ENCOUNTER — Other Ambulatory Visit (HOSPITAL_COMMUNITY)
Admission: RE | Admit: 2020-09-23 | Discharge: 2020-09-23 | Disposition: A | Discharge status: Home / Self Care | Payer: No Typology Code available for payment source | Source: Ambulatory Visit | Attending: Obstetrics and Gynecology | Admitting: Obstetrics and Gynecology

## 2020-09-23 ENCOUNTER — Ambulatory Visit (INDEPENDENT_AMBULATORY_CARE_PROVIDER_SITE_OTHER): Payer: No Typology Code available for payment source | Admitting: Obstetrics and Gynecology

## 2020-09-23 ENCOUNTER — Encounter: Payer: Self-pay | Admitting: Obstetrics and Gynecology

## 2020-09-23 ENCOUNTER — Other Ambulatory Visit: Payer: Self-pay

## 2020-09-23 VITALS — BP 117/78 | HR 101 | Temp 98.0°F | Wt 158.6 lb

## 2020-09-23 DIAGNOSIS — O0990 Supervision of high risk pregnancy, unspecified, unspecified trimester: Secondary | ICD-10-CM | POA: Diagnosis not present

## 2020-09-23 NOTE — Progress Notes (Signed)
   LOW-RISK PREGNANCY OFFICE VISIT Patient name: Madison Chambers MRN [id number]  Date of birth: 03-Oct-1980 Chief Complaint:   Routine Prenatal Visit  History of Present Illness:   Madison Chambers is a 40 y.o. G2P1001 female at [redacted]w[redacted]d with an Estimated Date of Delivery: 10/21/20 being seen today for ongoing management of a low-risk pregnancy.  Today she reports occasional contractions. She reports tripping over the open dishwasher door while walking through the dark kitchen Sunday night. She fell, but felt okay on Monday. She reports that she would have occasional sharp pains throughout the day on Monday that worsened. She was seen in MAU Monday night. She is feeling much better now. Contractions: Irregular. Vag. Bleeding: None.  Movement: Present. denies leaking of fluid. Review of Systems:   Pertinent items are noted in HPI Denies abnormal vaginal discharge w/ itching/odor/irritation, headaches, visual changes, shortness of breath, chest pain, abdominal pain, severe nausea/vomiting, or problems with urination or bowel movements unless otherwise stated above. Pertinent History Reviewed:  Reviewed past medical,surgical, social, obstetrical and family history.  Reviewed problem list, medications and allergies. Physical Assessment:   Vitals:   09/23/20 1436  BP: 117/78  Pulse: (!) 101  Temp: 98 F (36.7 C)  Weight: 158 lb 9.6 oz (71.9 kg)  Body mass index is 29.01 kg/m.        Physical Examination:   General appearance: Well appearing, and in no distress  Mental status: Alert, oriented to person, place, and time  Skin: Warm & dry  Cardiovascular: Normal heart rate noted  Respiratory: Normal respiratory effort, no distress  Abdomen: Soft, gravid, nontender  Pelvic: Cervical exam performed  Dilation: Closed Effacement (%): Thick Station: Ballotable  Extremities: Edema: None  Fetal Status: Fetal Heart Rate (bpm): 145 Fundal Height: 36 cm Movement: Present Presentation:  Vertex  No results found for this or any previous visit (from the past 24 hour(s)).  Assessment & Plan:  1) Low-risk pregnancy G2P1001 at [redacted]w[redacted]d with an Estimated Date of Delivery: 10/21/20   2) Supervision of high risk pregnancy, antepartum  - Cervicovaginal ancillary only( [place]),  - Culture, beta strep (group b only) - Planning water birth --> needs to upload certificate via My Chart    Meds: No orders of the defined types were placed in this encounter.  Labs/procedures today: GBS, GC/CT and cervical check  Plan:  Continue routine obstetrical care   Reviewed: Preterm labor symptoms and general obstetric precautions including but not limited to vaginal bleeding, contractions, leaking of fluid and fetal movement were reviewed in detail with the patient.  All questions were answered. Has home bp cuff. Check bp weekly, let us know if >140/90.   Follow-up: Return in about 2 weeks (around 10/07/2020) for Return OB visit.  Orders Placed This Encounter  Procedures  . Culture, beta strep (group b only)   Raelyn Mora MSN, CNM 09/23/2020 3:33 PM

## 2020-09-27 ENCOUNTER — Encounter: Payer: Self-pay | Admitting: *Deleted

## 2020-09-27 LAB — CULTURE, BETA STREP (GROUP B ONLY): Strep Gp B Culture: POSITIVE — AB

## 2020-09-27 LAB — CERVICOVAGINAL ANCILLARY ONLY
Bacterial Vaginitis (gardnerella): NEGATIVE
Candida Glabrata: NEGATIVE
Candida Vaginitis: POSITIVE — AB
Chlamydia: NEGATIVE
Comment: NEGATIVE
Comment: NEGATIVE
Comment: NEGATIVE
Comment: NEGATIVE
Comment: NEGATIVE
Comment: NORMAL
Neisseria Gonorrhea: NEGATIVE
Trichomonas: NEGATIVE

## 2020-09-28 ENCOUNTER — Telehealth: Payer: Self-pay | Admitting: *Deleted

## 2020-09-28 DIAGNOSIS — B3731 Acute candidiasis of vulva and vagina: Secondary | ICD-10-CM

## 2020-09-28 DIAGNOSIS — B373 Candidiasis of vulva and vagina: Secondary | ICD-10-CM

## 2020-09-28 MED ORDER — TERCONAZOLE 0.4 % VA CREA
1.0000 | TOPICAL_CREAM | Freq: Every day | VAGINAL | 0 refills | Status: DC
Start: 2020-09-28 — End: 2020-11-18

## 2020-09-28 NOTE — Telephone Encounter (Signed)
-----   Message from Raelyn Mora, [place] sent at 09/28/2020  8:56 AM EST ----- Please treat for yeast

## 2020-10-04 ENCOUNTER — Encounter (HOSPITAL_COMMUNITY): Payer: Self-pay | Admitting: Family Medicine

## 2020-10-04 ENCOUNTER — Inpatient Hospital Stay (HOSPITAL_BASED_OUTPATIENT_CLINIC_OR_DEPARTMENT_OTHER): Payer: No Typology Code available for payment source

## 2020-10-04 ENCOUNTER — Other Ambulatory Visit: Payer: Self-pay

## 2020-10-04 ENCOUNTER — Inpatient Hospital Stay (HOSPITAL_COMMUNITY)
Admission: AD | Admit: 2020-10-04 | Discharge: 2020-10-04 | Disposition: A | Discharge status: Home / Self Care | Payer: No Typology Code available for payment source | Attending: Family Medicine | Admitting: Family Medicine

## 2020-10-04 DIAGNOSIS — O360130 Maternal care for anti-D [Rh] antibodies, third trimester, not applicable or unspecified: Secondary | ICD-10-CM | POA: Diagnosis not present

## 2020-10-04 DIAGNOSIS — R1030 Lower abdominal pain, unspecified: Secondary | ICD-10-CM | POA: Diagnosis not present

## 2020-10-04 DIAGNOSIS — Z3A37 37 weeks gestation of pregnancy: Secondary | ICD-10-CM | POA: Diagnosis not present

## 2020-10-04 DIAGNOSIS — O99891 Other specified diseases and conditions complicating pregnancy: Secondary | ICD-10-CM

## 2020-10-04 DIAGNOSIS — Z6791 Unspecified blood type, Rh negative: Secondary | ICD-10-CM

## 2020-10-04 DIAGNOSIS — O9A213 Injury, poisoning and certain other consequences of external causes complicating pregnancy, third trimester: Secondary | ICD-10-CM | POA: Diagnosis not present

## 2020-10-04 DIAGNOSIS — O09529 Supervision of elderly multigravida, unspecified trimester: Secondary | ICD-10-CM

## 2020-10-04 DIAGNOSIS — O360120 Maternal care for anti-D [Rh] antibodies, second trimester, not applicable or unspecified: Secondary | ICD-10-CM | POA: Diagnosis not present

## 2020-10-04 DIAGNOSIS — Y92410 Unspecified street and highway as the place of occurrence of the external cause: Secondary | ICD-10-CM | POA: Diagnosis not present

## 2020-10-04 DIAGNOSIS — O26893 Other specified pregnancy related conditions, third trimester: Secondary | ICD-10-CM | POA: Insufficient documentation

## 2020-10-04 DIAGNOSIS — T1490XA Injury, unspecified, initial encounter: Secondary | ICD-10-CM | POA: Diagnosis not present

## 2020-10-04 DIAGNOSIS — O09523 Supervision of elderly multigravida, third trimester: Secondary | ICD-10-CM | POA: Diagnosis not present

## 2020-10-04 LAB — KLEIHAUER-BETKE STAIN
# Vials RhIg: 1
Fetal Cells %: 0 %
Quantitation Fetal Hemoglobin: 0 mL

## 2020-10-04 LAB — ABO/RH
ABO/RH(D): O NEG
Antibody Screen: NEGATIVE
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
## 2020-10-07 ENCOUNTER — Other Ambulatory Visit: Payer: Self-pay

## 2020-10-07 ENCOUNTER — Encounter: Payer: Self-pay | Admitting: Obstetrics and Gynecology

## 2020-10-07 ENCOUNTER — Ambulatory Visit (INDEPENDENT_AMBULATORY_CARE_PROVIDER_SITE_OTHER): Payer: No Typology Code available for payment source | Admitting: Obstetrics and Gynecology

## 2020-10-07 VITALS — BP 120/80 | HR 89 | Temp 98.2°F | Wt 162.6 lb

## 2020-10-07 DIAGNOSIS — Z3A38 38 weeks gestation of pregnancy: Secondary | ICD-10-CM

## 2020-10-07 DIAGNOSIS — O0990 Supervision of high risk pregnancy, unspecified, unspecified trimester: Secondary | ICD-10-CM
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
## 2020-10-14 ENCOUNTER — Other Ambulatory Visit: Payer: Self-pay

## 2020-10-14 ENCOUNTER — Encounter: Payer: Self-pay | Admitting: Obstetrics and Gynecology

## 2020-10-14 ENCOUNTER — Ambulatory Visit (INDEPENDENT_AMBULATORY_CARE_PROVIDER_SITE_OTHER): Payer: No Typology Code available for payment source | Admitting: Obstetrics and Gynecology

## 2020-10-14 VITALS — BP 113/72 | HR 98 | Temp 97.5°F | Wt 162.0 lb

## 2020-10-14 DIAGNOSIS — O0990 Supervision of high risk pregnancy, unspecified, unspecified trimester: Secondary | ICD-10-CM

## 2020-10-14 DIAGNOSIS — Z3A39 39 weeks gestation of pregnancy: Secondary | ICD-10-CM

## 2020-10-14 DIAGNOSIS — O09529 Supervision of elderly multigravida, unspecified trimester: Secondary | ICD-10-CM

## 2020-10-14 NOTE — Progress Notes (Signed)
   LOW-RISK PREGNANCY OFFICE VISIT Patient name: Madison Chambers MRN [id number]  Date of birth: 1980-09-07 Chief Complaint:   Routine Prenatal Visit  History of Present Illness:   Madison Chambers is a 40 y.o. G2P1001 female at [redacted]w[redacted]d with an Estimated Date of Delivery: 10/21/20 being seen today for ongoing management of a low-risk pregnancy.  Today she reports occasional contractions. She states she thinks that her previous OB office moved her EDC up 1 week when she told them she her cycles are 19-21 days instead of 28 day cycle.  Contractions: Irregular. Vag. Bleeding: None.  Movement: Present. denies leaking of fluid. Review of Systems:   Pertinent items are noted in HPI Denies abnormal vaginal discharge w/ itching/odor/irritation, headaches, visual changes, shortness of breath, chest pain, abdominal pain, severe nausea/vomiting, or problems with urination or bowel movements unless otherwise stated above. Pertinent History Reviewed:  Reviewed past medical,surgical, social, obstetrical and family history.  Reviewed problem list, medications and allergies. Physical Assessment:   Vitals:   10/14/20 1447  BP: 113/72  Pulse: 98  Temp: (!) 97.5 F (36.4 C)  Weight: 162 lb (73.5 kg)  Body mass index is 29.63 kg/m.        Physical Examination:   General appearance: Well appearing, and in no distress  Mental status: Alert, oriented to person, place, and time  Skin: Warm & dry  Cardiovascular: Normal heart rate noted  Respiratory: Normal respiratory effort, no distress  Abdomen: Soft, gravid, nontender  Pelvic: Cervical exam deferred         Extremities: Edema: None  Fetal Status: Fetal Heart Rate (bpm): 151 Fundal Height: 36 cm Movement: Present Presentation: Vertex  No results found for this or any previous visit (from the past 24 hour(s)).  Assessment & Plan:  1) Low-risk pregnancy G2P1001 at [redacted]w[redacted]d with an Estimated Date of Delivery: 10/21/20   2) Supervision of high risk  pregnancy, antepartum - Does not desire to be scheduled for IOL. Will consider being scheduled at next week's visit - Explained that shorter menstrual cycles can influence EDC, but we can only go by what is documented on her previous prenatal records.   3) Antepartum multigravida of advanced maternal age  1) [redacted] weeks gestation of pregnancy   Meds: No orders of the defined types were placed in this encounter.  Labs/procedures today: none  Plan:  Continue routine obstetrical care   Reviewed: Term labor symptoms and general obstetric precautions including but not limited to vaginal bleeding, contractions, leaking of fluid and fetal movement were reviewed in detail with the patient.  All questions were answered. Has home bp cuff. Check bp weekly, let us know if >140/90.   Follow-up: Return in about 1 week (around 10/21/2020) for Return OB visit.   Raelyn Mora MSN, CNM 10/14/2020 2:50 PM

## 2020-10-21 ENCOUNTER — Telehealth (HOSPITAL_COMMUNITY): Payer: Self-pay | Admitting: *Deleted

## 2020-10-21 ENCOUNTER — Inpatient Hospital Stay (HOSPITAL_COMMUNITY)
Admission: AD | Admit: 2020-10-21 | Discharge: 2020-10-23 | DRG: 807 | Disposition: A | Discharge status: Home / Self Care | Payer: No Typology Code available for payment source | Attending: Obstetrics & Gynecology | Admitting: Obstetrics & Gynecology

## 2020-10-21 ENCOUNTER — Other Ambulatory Visit: Payer: Self-pay | Admitting: Obstetrics and Gynecology

## 2020-10-21 ENCOUNTER — Other Ambulatory Visit: Payer: Self-pay

## 2020-10-21 ENCOUNTER — Encounter (HOSPITAL_COMMUNITY): Payer: Self-pay | Admitting: Obstetrics & Gynecology

## 2020-10-21 ENCOUNTER — Encounter: Payer: Self-pay | Admitting: Obstetrics and Gynecology

## 2020-10-21 ENCOUNTER — Ambulatory Visit (INDEPENDENT_AMBULATORY_CARE_PROVIDER_SITE_OTHER): Payer: No Typology Code available for payment source | Admitting: Obstetrics and Gynecology

## 2020-10-21 VITALS — BP 114/76 | HR 94 | Wt 163.4 lb

## 2020-10-21 DIAGNOSIS — O99824 Streptococcus B carrier state complicating childbirth: Secondary | ICD-10-CM | POA: Diagnosis present

## 2020-10-21 DIAGNOSIS — O26893 Other specified pregnancy related conditions, third trimester: Secondary | ICD-10-CM | POA: Diagnosis present

## 2020-10-21 DIAGNOSIS — Z6791 Unspecified blood type, Rh negative: Secondary | ICD-10-CM

## 2020-10-21 DIAGNOSIS — O09529 Supervision of elderly multigravida, unspecified trimester: Secondary | ICD-10-CM

## 2020-10-21 DIAGNOSIS — O0990 Supervision of high risk pregnancy, unspecified, unspecified trimester: Secondary | ICD-10-CM

## 2020-10-21 DIAGNOSIS — Z3A40 40 weeks gestation of pregnancy: Secondary | ICD-10-CM

## 2020-10-21 DIAGNOSIS — Z20822 Contact with and (suspected) exposure to covid-19: Secondary | ICD-10-CM | POA: Diagnosis present

## 2020-10-21 LAB — CBC
HCT: 36.2 % (ref 36.0–46.0)
Hemoglobin: 12.1 g/dL (ref 12.0–15.0)
MCH: 31.4 pg (ref 26.0–34.0)
MCHC: 33.4 g/dL (ref 30.0–36.0)
MCV: 94 fL (ref 80.0–100.0)
Platelets: 156 10*3/uL (ref 150–400)
RBC: 3.85 MIL/uL — ABNORMAL LOW (ref 3.87–5.11)
RDW: 13.7 % (ref 11.5–15.5)
WBC: 7.8 10*3/uL (ref 4.0–10.5)
nRBC: 0 % (ref 0.0–0.2)

## 2020-10-21 LAB — RESP PANEL BY RT-PCR (FLU A&B, COVID) ARPGX2
Influenza A by PCR: NEGATIVE
Influenza B by PCR: NEGATIVE
SARS Coronavirus 2 by RT PCR: NEGATIVE

## 2020-10-21 LAB — TYPE AND SCREEN
ABO/RH(D): O NEG
Antibody Screen: NEGATIVE

## 2020-10-21 MED ORDER — OXYTOCIN BOLUS FROM INFUSION
333.0000 mL | Freq: Once | INTRAVENOUS | Status: AC
  Administered 2020-10-21: 333 mL via INTRAVENOUS

## 2020-10-21 MED ORDER — DIBUCAINE (PERIANAL) 1 % EX OINT: 1.0000 "application " | TOPICAL_OINTMENT | CUTANEOUS | Status: DC | PRN

## 2020-10-21 MED ORDER — MEDROXYPROGESTERONE ACETATE 150 MG/ML IM SUSP: 150.0000 mg | INTRAMUSCULAR | Status: DC | PRN

## 2020-10-21 MED ORDER — LACTATED RINGERS IV SOLN: INTRAVENOUS | Status: DC

## 2020-10-21 MED ORDER — SODIUM CHLORIDE 0.9 % IV SOLN
5.0000 10*6.[IU] | Freq: Once | INTRAVENOUS | Status: AC
  Administered 2020-10-21: 5 10*6.[IU] via INTRAVENOUS
  Filled 2020-10-21: qty 5

## 2020-10-21 MED ORDER — FERROUS SULFATE 325 (65 FE) MG PO TABS
325.0000 mg | ORAL_TABLET | ORAL | Status: DC
Start: 2020-10-22 — End: 2020-10-23
  Administered 2020-10-22: 325 mg via ORAL
  Filled 2020-10-21: qty 1

## 2020-10-21 MED ORDER — TETANUS-DIPHTH-ACELL PERTUSSIS 5-2.5-18.5 LF-MCG/0.5 IM SUSY: 0.5000 mL | PREFILLED_SYRINGE | Freq: Once | INTRAMUSCULAR | Status: DC

## 2020-10-21 MED ORDER — METHYLERGONOVINE MALEATE 0.2 MG PO TABS: 0.2000 mg | ORAL_TABLET | ORAL | Status: DC | PRN

## 2020-10-21 MED ORDER — IBUPROFEN 600 MG PO TABS
600.0000 mg | ORAL_TABLET | Freq: Four times a day (QID) | ORAL | Status: DC
Start: 2020-10-22 — End: 2020-10-23
  Administered 2020-10-22 – 2020-10-23 (×7): 600 mg via ORAL
  Filled 2020-10-21 (×7): qty 1

## 2020-10-21 MED ORDER — SIMETHICONE 80 MG PO CHEW: 80.0000 mg | CHEWABLE_TABLET | ORAL | Status: DC | PRN

## 2020-10-21 MED ORDER — TERBUTALINE SULFATE 1 MG/ML IJ SOLN
0.2500 mg | Freq: Once | INTRAMUSCULAR | Status: DC | PRN
Start: 2020-10-21 — End: 2020-10-21

## 2020-10-21 MED ORDER — PRENATAL MULTIVITAMIN CH
1.0000 | ORAL_TABLET | Freq: Every day | ORAL | Status: DC
  Administered 2020-10-22 – 2020-10-23 (×2): 1 via ORAL
  Filled 2020-10-21 (×2): qty 1

## 2020-10-21 MED ORDER — DIPHENHYDRAMINE HCL 25 MG PO CAPS: 25.0000 mg | ORAL_CAPSULE | Freq: Four times a day (QID) | ORAL | Status: DC | PRN

## 2020-10-21 MED ORDER — WITCH HAZEL-GLYCERIN EX PADS: 1.0000 "application " | MEDICATED_PAD | CUTANEOUS | Status: DC | PRN

## 2020-10-21 MED ORDER — LACTATED RINGERS IV SOLN: 500.0000 mL | INTRAVENOUS | Status: DC | PRN

## 2020-10-21 MED ORDER — BENZOCAINE-MENTHOL 20-0.5 % EX AERO
1.0000 "application " | INHALATION_SPRAY | CUTANEOUS | Status: DC | PRN
  Filled 2020-10-21: qty 56

## 2020-10-21 MED ORDER — ACETAMINOPHEN 325 MG PO TABS
650.0000 mg | ORAL_TABLET | ORAL | Status: DC | PRN
  Administered 2020-10-22 – 2020-10-23 (×5): 650 mg via ORAL
  Filled 2020-10-21 (×5): qty 2

## 2020-10-21 MED ORDER — HYDROXYZINE HCL 50 MG PO TABS: 50.0000 mg | ORAL_TABLET | Freq: Four times a day (QID) | ORAL | Status: DC | PRN

## 2020-10-21 MED ORDER — PENICILLIN G POT IN DEXTROSE 60000 UNIT/ML IV SOLN: 3.0000 10*6.[IU] | INTRAVENOUS | Status: DC

## 2020-10-21 MED ORDER — FENTANYL CITRATE (PF) 100 MCG/2ML IJ SOLN
100.0000 ug | INTRAMUSCULAR | Status: DC | PRN
  Administered 2020-10-21: 100 ug via INTRAVENOUS
  Filled 2020-10-21 (×2): qty 2

## 2020-10-21 MED ORDER — OXYTOCIN-SODIUM CHLORIDE 30-0.9 UT/500ML-% IV SOLN: 1.0000 m[IU]/min | INTRAVENOUS | Status: DC

## 2020-10-21 MED ORDER — ONDANSETRON HCL 4 MG PO TABS: 4.0000 mg | ORAL_TABLET | ORAL | Status: DC | PRN

## 2020-10-21 MED ORDER — OXYTOCIN-SODIUM CHLORIDE 30-0.9 UT/500ML-% IV SOLN
2.5000 [IU]/h | INTRAVENOUS | Status: DC
  Administered 2020-10-21: 2.5 [IU]/h via INTRAVENOUS
  Filled 2020-10-21: qty 500

## 2020-10-21 MED ORDER — BISACODYL 10 MG RE SUPP
10.0000 mg | Freq: Every day | RECTAL | Status: DC | PRN
  Filled 2020-10-21: qty 1

## 2020-10-21 MED ORDER — FLEET ENEMA 7-19 GM/118ML RE ENEM: 1.0000 | ENEMA | Freq: Every day | RECTAL | Status: DC | PRN

## 2020-10-21 MED ORDER — METHYLERGONOVINE MALEATE 0.2 MG/ML IJ SOLN: 0.2000 mg | INTRAMUSCULAR | Status: DC | PRN

## 2020-10-21 MED ORDER — ONDANSETRON HCL 4 MG/2ML IJ SOLN: 4.0000 mg | Freq: Four times a day (QID) | INTRAMUSCULAR | Status: DC | PRN

## 2020-10-21 MED ORDER — ACETAMINOPHEN 325 MG PO TABS: 650.0000 mg | ORAL_TABLET | ORAL | Status: DC | PRN

## 2020-10-21 MED ORDER — FENTANYL CITRATE (PF) 100 MCG/2ML IJ SOLN
100.0000 ug | INTRAMUSCULAR | Status: DC | PRN
  Administered 2020-10-21: 100 ug via INTRAVENOUS

## 2020-10-21 MED ORDER — MEASLES, MUMPS & RUBELLA VAC IJ SOLR: 0.5000 mL | Freq: Once | INTRAMUSCULAR | Status: DC

## 2020-10-21 MED ORDER — ONDANSETRON HCL 4 MG/2ML IJ SOLN: 4.0000 mg | INTRAMUSCULAR | Status: DC | PRN

## 2020-10-21 MED ORDER — LIDOCAINE HCL (PF) 1 % IJ SOLN
30.0000 mL | INTRAMUSCULAR | Status: AC | PRN
  Administered 2020-10-21: 30 mL via SUBCUTANEOUS
  Filled 2020-10-21: qty 30

## 2020-10-21 MED ORDER — DOCUSATE SODIUM 100 MG PO CAPS
100.0000 mg | ORAL_CAPSULE | Freq: Two times a day (BID) | ORAL | Status: DC
  Administered 2020-10-22 – 2020-10-23 (×3): 100 mg via ORAL
  Filled 2020-10-21 (×3): qty 1

## 2020-10-21 MED ORDER — COCONUT OIL OIL: 1.0000 "application " | TOPICAL_OIL | Status: DC | PRN

## 2020-10-21 NOTE — Discharge Summary (Addendum)
Postpartum Discharge Summary  Date of Service updated 10/23/20     Patient Name: Madison Chambers DOB: [birth year] MRN: [id number]  Date of admission: 10/21/2020 Delivery date:10/21/2020  Delivering provider: Christin Fudge  Date of discharge: 10/23/2020  Admitting diagnosis: Indication for care in labor or delivery [O75.9] Intrauterine pregnancy: [redacted]w[redacted]d    Secondary diagnosis:  Active Problems:   Indication for care in labor or delivery  Additional problems: None    Discharge diagnosis: Term Pregnancy Delivered                                              Post partum procedures:rhogam Augmentation: none Complications: None  Hospital course: Onset of Labor With Vaginal Delivery      40y.o. yo G2P1001 at 459w0das admitted in Active Labor on 10/21/2020. Patient had an uncomplicated labor course as follows:  Membrane Rupture Time/Date: 7:51 PM ,10/21/2020   Delivery Method:Vaginal, Spontaneous  Episiotomy: None  Lacerations:  2nd degree  Patient had an uncomplicated postpartum course.  She is ambulating, tolerating a regular diet, passing flatus, and urinating well. Patient is discharged home in stable condition on 10/23/20.  Newborn Data: Birth date:10/21/2020  Birth time:9:20 PM  Gender:Female  Living status:Living  Apgars:8 ,9  Weight:3039 g   Magnesium Sulfate received: No BMZ received: No Rhophylac:Yes MMR:N/A T-DaP:Given prenatally Flu: declined Transfusion:No  Physical exam  Vitals:   10/22/20 0750 10/22/20 1135 10/22/20 2030 10/23/20 0532  BP: 100/60 103/67 105/67 108/74  Pulse: 76 92 95 84  Resp: _0 Temp: 97.9 F (36.6 C) 98.2 F (36.8 C) 98.2 F (36.8 C) 98.2 F (36.8 C)  TempSrc: Oral Oral Oral Oral  SpO2: 100% 100% 100% 100%  Weight:      Height:       General: alert, cooperative and no distress Lochia: appropriate Uterine Fundus: firm Incision: N/A DVT Evaluation: No evidence of DVT seen on physical exam. Labs: Lab  Results  Component Value Date   WBC 7.8 10/21/2020   HGB 12.1 10/21/2020   HCT 36.2 10/21/2020   MCV 94.0 10/21/2020   PLT 156 10/21/2020   CMP Latest Ref Rng & Units 05/05/2010  Glucose 70 - 99 mg/dL 91  BUN 6 - 23 mg/dL 9  Creatinine 0.4 - 1.2 mg/dL 0.5  Sodium 135 - 145 meq/L 140  Potassium 3.5 - 5.1 meq/L 4.9  Chloride 96 - 112 meq/L 107  CO2 19 - 32 meq/L 26  Calcium 8.4 - 10.5 mg/dL 9.6  Total Protein 6.0 - 8.3 g/dL 7.6  Total Bilirubin 0.3 - 1.2 mg/dL 0.9  Alkaline Phos 39 - 117 units/L 54  AST 0 - 37 units/L 27  ALT 0 - 35 units/L 32   Edinburgh Score: Edinburgh Postnatal Depression Scale Screening Tool 10/21/2020  I have been able to laugh and see the funny side of things. 0  I have looked forward with enjoyment to things. 0  I have blamed myself unnecessarily when things went wrong. 0  I have been anxious or worried for no good reason. 0  I have felt scared or panicky for no good reason. 0  Things have been getting on top of me. 0  I have been so unhappy that I have had difficulty sleeping. 0  I have felt sad or miserable. 0  I have been  so unhappy that I have been crying. 0  The thought of harming myself has occurred to me. 0  Edinburgh Postnatal Depression Scale Total 0     After visit meds:  Allergies as of 10/23/2020      Reactions   Latex Itching      Medication List    TAKE these medications   ibuprofen 600 MG tablet Commonly known as: ADVIL Take 1 tablet (600 mg total) by mouth every 6 (six) hours.   prenatal multivitamin Tabs tablet Take 1 tablet by mouth at bedtime.   terconazole 0.4 % vaginal cream Commonly known as: TERAZOL 7 Place 1 applicator vaginally at bedtime. For 3 nights        Discharge home in stable condition Infant Feeding: routine diet Infant Disposition:discharge tomorrow, 10/24/20 due to inadequate GBS treatment Discharge instruction: per After Visit Summary and Postpartum booklet. Activity: Advance as tolerated. Pelvic  rest for 6 weeks.  Diet: routine diet Future Appointments: Future Appointments  Date Time Provider [place]  11/18/2020  9:10 AM Lajean Manes, CNM [facility] None   Follow up Visit:  Follow-up Information    [facility] Follow up in 4 week(s).   Specialty: Obstetrics and Gynecology Contact information: [place] [phone]       Paula Compton, MD Follow up.   Specialty: Obstetrics and Gynecology Why: For postpartum care by pt OB/Gyn as desired. Contact information: [address] [place] [address] [phone]                Please schedule this patient for a In person postpartum visit in 4 weeks with the following provider: Any provider. Additional Postpartum F/U:  Low risk pregnancy complicated by:  Delivery mode:  Vaginal, Spontaneous  Anticipated Birth Control:  Nexplanon   10/23/2020 Fatima Blank, CNM

## 2020-10-21 NOTE — Telephone Encounter (Signed)
Preadmission screen Pt states she is contracting every 3-4 minutes and is coming to the hospital.  Will recall tomorrow if she is undelivered

## 2020-10-21 NOTE — Lactation Note (Signed)
This note was copied from a baby's chart. Lactation Consultation Note  Patient Name: Madison Chambers [id number] Date: 10/21/2020 Reason for consult: L&D Initial assessment Age:39 hours  L&D consult with 60 minutes old infant and P2 mother. Parents are present at time of consult. Congratulated them on their newborn. Infant is skin to skin prone on mother's chest. Discussed STS as ideal transition for infants after birth helping with temperature, blood sugar and comfort. Talked about primal reflexes such as rooting, hands to mouth, searching for the breast among others.   Assisted with latch laid back, left breast at this time. Explained LC services availability during postpartum stay. Thanked family for their time.    Maternal Data Does the patient have breastfeeding experience prior to this delivery?: Yes  Feeding Mother's Current Feeding Choice: Breast Milk  LATCH Score Latch: Grasps breast easily, tongue down, lips flanged, rhythmical sucking.  Audible Swallowing: Spontaneous and intermittent  Type of Nipple: Everted at rest and after stimulation (short shaft)  Comfort (Breast/Nipple): Soft / non-tender  Hold (Positioning): Assistance needed to correctly position infant at breast and maintain latch.  LATCH Score: 9   Interventions Interventions: Assisted with latch;Skin to skin;Expressed milk;Education   Consult Status Consult Status: Follow-up Date: 10/21/20 Follow-up type: In-patient    Linels A Higuera Ancidey 10/21/2020, 10:22 PM

## 2020-10-21 NOTE — H&P (Signed)
OBSTETRIC ADMISSION HISTORY AND PHYSICAL  Madison Chambers is a 40 y.o. female G2P1001 with IUP at [redacted]w[redacted]d presenting for labor. She reports +FMs. No LOF, VB, blurry vision, headaches, peripheral edema, or RUQ pain. She plans on breastfeeding. She requests POPs for birth control.  Dating: By 10w [place] --->  Estimated Date of Delivery: 10/21/20  Sono:    @[redacted]w[redacted]d , normal anatomy ([facility])   Prenatal History/Complications: - hx of depression - AMA - Rh negative  Past Medical History: Past Medical History:  Diagnosis Date  . Mental disorder    Depressive Disorder; Seasonal Affective Disorder    Past Surgical History: Past Surgical History:  Procedure Laterality Date  . MOUTH SURGERY  2002    Obstetrical History: OB History    Gravida  2   Para  1   Term  1   Preterm      AB      Living  1     SAB      IAB      Ectopic      Multiple      Live Births  1           Social History: Social History   Socioeconomic History  . Marital status: Married    Spouse name: 2003  . Number of children: 1  . Years of education: Not on file  . Highest education level: Master's degree (e.g., MA, MS, MEng, MEd, MSW, MBA)  Occupational History  . Occupation: Nurse  Tobacco Use  . Smoking status: Never Smoker  . Smokeless tobacco: Never Used  Vaping Use  . Vaping Use: Never used  Substance and Sexual Activity  . Alcohol use: Not Currently  . Drug use: Never  . Sexual activity: Yes    Birth control/protection: None  Other Topics Concern  . Not on file  Social History Narrative   [name]   Social Determinants of Health   Financial Resource Strain: Low Risk   . Difficulty of Paying Living Expenses: Not hard at all  Food Insecurity: No Food Insecurity  . Worried About Facilities manager in the Last Year: Never true  . Ran Out of Food in the Last Year: Never true  Transportation Needs: No Transportation Needs  . Lack of Transportation (Medical):  No  . Lack of Transportation (Non-Medical): No  Physical Activity: Inactive  . Days of Exercise per Week: 0 days  . Minutes of Exercise per Session: 0 min  Stress: No Stress Concern Present  . Feeling of Stress : Only a little  Social Connections: Not on file    Family History: Family History  Problem Relation Age of Onset  . Hypertension Mother     Allergies: Allergies  Allergen Reactions  . Latex     Medications Prior to Admission  Medication Sig Dispense Refill Last Dose  . Prenatal Vit-Fe Fumarate-FA (PRENATAL VITAMINS PO) Take by mouth.   10/20/2020 at Unknown time  . terconazole (TERAZOL 7) 0.4 % vaginal cream Place 1 applicator vaginally at bedtime. For 3 nights 45 g 0 Past Week at Unknown time     Review of Systems:  All systems reviewed and negative except as stated in HPI  PE: Blood pressure 132/80, pulse 87, temperature 99.2 F (37.3 C), temperature source Oral, resp. rate 17, height 5\' 2"  (1.575 m), weight 74.4 kg, last menstrual period 01/21/2020. General appearance: alert, cooperative and no distress Lungs: regular rate and effort Heart: regular rate  Abdomen: soft,  non-tender Extremities: Homans sign is negative, no sign of DVT Presentation: cephalic per RN EFM: 145 bpm, mod variability, + accels, variable decels Toco: q5 Dilation: 4 Effacement (%): 70 Station: -3 Exam by:: [employer] RN  Prenatal labs: ABO, Rh: --/--/PENDING (02/24 1802) Antibody: PENDING (02/24 1802) Rubella: Immune (08/10 0000) RPR: Non Reactive (12/08 0841)  HBsAg: Negative (08/10 0000)  HIV: Non Reactive (12/08 0841)  GBS: Positive/-- (01/27 1508)  2 hr GTT normal  Prenatal Transfer Tool  Maternal Diabetes: No Genetic Screening: Declined Maternal Ultrasounds/Referrals: Normal Fetal Ultrasounds or other Referrals:  None Maternal Substance Abuse:  No Significant Maternal Medications:  None Significant Maternal Lab Results: Group B Strep positive  Results for  orders placed or performed during the hospital encounter of 10/21/20 (from the past 24 hour(s))  Type and screen   Collection Time: 10/21/20  6:02 PM  Result Value Ref Range   ABO/RH(D) PENDING    Antibody Screen PENDING    Sample Expiration      10/24/2020,2359 Performed at [facility], [address], [place], [place] [address]     Patient Active Problem List   Diagnosis Date Noted  . Supervision of high risk pregnancy, antepartum 07/05/2020  . Antepartum multigravida of advanced maternal age 57/03/2020  . NUMBNESS, HAND 05/05/2010    Assessment: Madison Chambers is a 40 y.o. G2P1001 at [redacted]w[redacted]d here for labor  1. Labor: early 2. FWB: Cat II 3. Pain: H2O, analgesia, anesthesia prn 4. GBS: pos   Plan: Admit to LD PCN Consider WB pending FHR tracing Anticipate SVD  Donette Larry, CNM  10/21/2020, 6:52 PM

## 2020-10-21 NOTE — MAU Note (Signed)
.  Madison Chambers is a 40 y.o. at [redacted]w[redacted]d here in MAU reporting: ctx that are 7 minutes apart. She states that she had her membranes swept in the office today at 1400. Reports bloody show. Denies LOF. Endorses good fetal movement.

## 2020-10-21 NOTE — Progress Notes (Signed)
   LOW-RISK PREGNANCY OFFICE VISIT Patient name: Madison Chambers MRN [id number]  Date of birth: 06-08-1981 Chief Complaint:   Routine Prenatal Visit  History of Present Illness:   Madison Chambers is a 40 y.o. G2P1001 female at [redacted]w[redacted]d with an Estimated Date of Delivery: 10/21/20 being seen today for ongoing management of a low-risk pregnancy.  Today she reports contractions since last night -- not timing them. She desires to be scheduled for induction of labor at the earliest day and time. Contractions: Irregular. Vag. Bleeding: None.  Movement: Present. denies leaking of fluid. Review of Systems:   Pertinent items are noted in HPI Denies abnormal vaginal discharge w/ itching/odor/irritation, headaches, visual changes, shortness of breath, chest pain, abdominal pain, severe nausea/vomiting, or problems with urination or bowel movements unless otherwise stated above. Pertinent History Reviewed:  Reviewed past medical,surgical, social, obstetrical and family history.  Reviewed problem list, medications and allergies. Physical Assessment:   Vitals:   10/21/20 1421  BP: 114/76  Pulse: 94  Weight: 163 lb 6.4 oz (74.1 kg)  Body mass index is 29.89 kg/m.        Physical Examination:   General appearance: Well appearing, and in no distress  Mental status: Alert, oriented to person, place, and time  Skin: Warm & dry  Cardiovascular: Normal heart rate noted  Respiratory: Normal respiratory effort, no distress  Abdomen: Soft, gravid, nontender  Pelvic: Cervical exam performed  Dilation: 3 Effacement (%): 80 Station: -3  membranes swept with verbal consent  Extremities: Edema: None  Fetal Status: Fetal Heart Rate (bpm): 150 Fundal Height: 40 cm Movement: Present Presentation: Vertex   Assessment & Plan:  1) Low-risk pregnancy G2P1001 at [redacted]w[redacted]d with an Estimated Date of Delivery: 10/21/20   2) Supervision of high risk pregnancy, antepartum - IOL scheduled for 20/28/2022 AM - Desires  waterbirth. Discussed with patient that if pitocin needed can be turned off once in active labor  3) Antepartum multigravida of advanced maternal age  67) [redacted] weeks gestation of pregnancy    Meds: No orders of the defined types were placed in this encounter.  Labs/procedures today: cervical exam  Plan:  Continue routine obstetrical care   Reviewed: Term labor symptoms and general obstetric precautions including but not limited to vaginal bleeding, contractions, leaking of fluid and fetal movement were reviewed in detail with the patient.  All questions were answered. Has home bp cuff. Check bp weekly, let us know if >140/90.   Follow-up: Return in about 1 week (around 10/28/2020) for Return OB visit.  No orders of the defined types were placed in this encounter.  Raelyn Mora MSN, CNM 10/21/2020 2:44 PM

## 2020-10-22 LAB — RPR: RPR Ser Ql: NONREACTIVE

## 2020-10-22 MED ORDER — OXYCODONE HCL 5 MG PO TABS
5.0000 mg | ORAL_TABLET | ORAL | Status: DC | PRN
  Administered 2020-10-22 – 2020-10-23 (×3): 5 mg via ORAL
  Filled 2020-10-22 (×3): qty 1

## 2020-10-22 MED ORDER — RHO D IMMUNE GLOBULIN 1500 UNIT/2ML IJ SOSY
300.0000 ug | PREFILLED_SYRINGE | Freq: Once | INTRAMUSCULAR | Status: AC
  Administered 2020-10-22: 300 ug via INTRAVENOUS
  Filled 2020-10-22: qty 2

## 2020-10-22 NOTE — Progress Notes (Signed)
Post Partum Day 1 Subjective: no complaints, up ad lib, voiding and tolerating PO, small lochia, plans to breastfeed, oral progesterone-only contraceptive  Objective: Blood pressure 112/72, pulse 79, temperature 98.2 F (36.8 C), temperature source Oral, resp. rate 19, height 5\' 2"  (1.575 m), weight 74.4 kg, last menstrual period 01/21/2020, SpO2 100 %, unknown if currently breastfeeding.  Physical Exam:  General: alert, cooperative and no distress Lochia:normal flow Chest: CTAB Heart: RRR no m/r/g Abdomen: +BS, soft, nontender,  Uterine Fundus: firm DVT Evaluation: No evidence of DVT seen on physical exam. Extremities: no edema  Recent Labs    10/21/20 1802  HGB 12.1  HCT 36.2    Assessment/Plan: Plan for discharge tomorrow, Breastfeeding, Lactation consult and Circumcision prior to discharge   LOS: 1 day   10/23/20 10/22/2020, 7:37 AM

## 2020-10-22 NOTE — Progress Notes (Signed)
Patient ID: Madison Chambers, female   DOB: 09-19-1980, 40 y.o.   MRN: [id number]  S:  RN called for assessment, pt complaining of increased vaginal pressure/pain, cannot ambulate because of it.   Pt had taken oxycodone prior to exam, able to ambulate around room with good mobility. Pain/pressure begins at pubic bone and extends to vulva/vagina/rectum. Much improved with oxycodone.  O: BP 103/67 (BP Location: Right Arm)   Pulse 92   Temp 98.2 F (36.8 C) (Oral)   Resp 16   Ht 5\' 2"  (1.575 m)   Wt 164 lb (74.4 kg)   LMP 01/21/2020   SpO2 100%   Breastfeeding Unknown   BMI 30.00 kg/m   Perineal laceration healing well without excessive edema or discoloration. No erythema or bruising noted. No bulging of anterior/posterior walls of vagina.   A/P: Normal postpartum recovery - encouraged pt to continue oxycodone prn today - gave reassurance of normal recovery and pain expectations - pt verbalized understanding  01/23/2020, CNM, MSN, IBCLC Certified Nurse Midwife, [facility]

## 2020-10-22 NOTE — Social Work (Signed)
MOB was referred for history of depression.   * Referral screened out by Clinical Social Worker because none of the following criteria appear to apply:  ~ History of anxiety/depression during this pregnancy, or of post-partum depression following prior delivery. ~ Diagnosis of anxiety and/or depression within last 3 years. CSW reviewed chart and notes depression in 2018. OR * MOB's symptoms currently being treated with medication and/or therapy.  Please contact the Clinical Social Worker if needs arise, by MOB request, or if MOB scores greater than 9/yes to question 10 on Edinburgh Postpartum Depression Screen.  Secily Walthour, LCSWA Clinical Social Work [facility]  [phone] 

## 2020-10-23 ENCOUNTER — Other Ambulatory Visit (HOSPITAL_COMMUNITY): Payer: No Typology Code available for payment source

## 2020-10-23 LAB — RH IG WORKUP (INCLUDES ABO/RH)
ABO/RH(D): O NEG
Fetal Screen: NEGATIVE
Gestational Age(Wks): 40
Unit division: 0

## 2020-10-23 MED ORDER — IBUPROFEN 600 MG PO TABS: 600.0000 mg | ORAL_TABLET | Freq: Four times a day (QID) | ORAL | 0 refills | Status: DC

## 2020-10-24 ENCOUNTER — Ambulatory Visit: Payer: Self-pay

## 2020-10-24 NOTE — Lactation Note (Signed)
This note was copied from a baby's chart. Lactation Consultation Note  Patient Name: Boy Carisa Backhaus [id number] Date: 10/24/2020 Reason for consult: Follow-up assessment Age:39 hours  P2 mother whose infant is now 42 hours old.  This is a term baby at 40+0 weeks.  LC order placed at 0918 this morning.  RN requested latch assistance due to sore nipples.  Baby was asleep in the bassinet but starting to awaken when I arrived.  Offered to assist with latching and mother agreeable.  Breast feeding baaics reviewed with mother.  Nipple/areola care reviewed.  Mother has positional stripes on both nipples; reddened and irritated.  Asked mother to hand express and she was able to express colostrum drops which I finger fed back to baby.  Assisted to latch easily in the cross cradle hold on the right breast.  With stimulation baby began sucking rhythmically.  Mother very pleased and excited to know that he could latch without it being painful.  Breast compressions and gentle stimulation demonstrated.  Baby continued to suck actively and many audible swallows noted.  Continued to educate during the 23 minutes he fed.  Showed mother how to break the suction at the end of the feeding and baby was content and sleepy.  Placed him STS on mother's chest and he fell asleep.  Mother very appreciative and relieved.  Reassurance and emotional support given.  Engorgement prevention/treatment reviewed.  Manual pump provided with instructions for use.  Mother has a DEBP for home use.  Father present and receptive to teaching as well.  Family ready for discharge and RN updated.  Mother has our OP phone number for any concerns after discharge.  Discussed how to obtain an OP consult if needed.    Maternal Data    Feeding    LATCH Score Latch: Grasps breast easily, tongue down, lips flanged, rhythmical sucking.  Audible Swallowing: Spontaneous and intermittent  Type of Nipple: Everted at rest and after  stimulation  Comfort (Breast/Nipple): Filling, red/small blisters or bruises, mild/mod discomfort  Hold (Positioning): Assistance needed to correctly position infant at breast and maintain latch.  LATCH Score: 8   Lactation Tools Discussed/Used    Interventions Interventions: Breast feeding basics reviewed;Skin to skin;Assisted with latch;Breast compression;Adjust position;Support pillows;Position options;Education  Discharge    Consult Status Consult Status: Complete Date: 10/24/20 Follow-up type: Call as needed    Irene Pap DelFava 10/24/2020, 1:41 PM

## 2020-10-24 NOTE — Lactation Note (Signed)
This note was copied from a baby's chart. Lactation Consultation Note  Patient Name: Madison Chambers [id number] Date: 10/24/2020 Reason for consult: Follow-up assessment Age:39 hours   LC Follow Up Visit:  Mother in shower when I arrived; asked father to call me back for a return visit when mother is available.  Father verbalized understanding.   Maternal Data    Feeding    LATCH Score                    Lactation Tools Discussed/Used    Interventions    Discharge    Consult Status Consult Status: Follow-up Date: 10/24/20 Follow-up type: In-patient    [name] R Harald Quevedo 10/24/2020, 10:56 AM

## 2020-10-24 NOTE — Lactation Note (Signed)
This note was copied from a baby's chart. Lactation Consultation Note  Patient Name: Madison Chambers [id number] Date: 10/24/2020 Reason for consult: Follow-up assessment Age:39 hours  Maternal Data    Feeding    LATCH Score Latch: Grasps breast easily, tongue down, lips flanged, rhythmical sucking.  Audible Swallowing: Spontaneous and intermittent  Type of Nipple: Everted at rest and after stimulation  Comfort (Breast/Nipple): Filling, red/small blisters or bruises, mild/mod discomfort  Hold (Positioning): Assistance needed to correctly position infant at breast and maintain latch.  LATCH Score: 8   Lactation Tools Discussed/Used    Interventions Interventions: Breast feeding basics reviewed;Skin to skin;Assisted with latch;Breast compression;Adjust position;Support pillows;Position options;Education  Discharge    Consult Status Consult Status: Complete Date: 10/24/20 Follow-up type: Call as needed    Irene Pap DelFava 10/24/2020, 1:37 PM

## 2020-10-25 ENCOUNTER — Inpatient Hospital Stay (HOSPITAL_COMMUNITY): Payer: No Typology Code available for payment source

## 2020-10-25 ENCOUNTER — Inpatient Hospital Stay (HOSPITAL_COMMUNITY)
Admission: AD | Admit: 2020-10-25 | Payer: No Typology Code available for payment source | Source: Home / Self Care | Admitting: Family Medicine

## 2020-11-15 ENCOUNTER — Encounter: Payer: Self-pay | Admitting: *Deleted

## 2020-11-18 ENCOUNTER — Encounter: Payer: Self-pay | Admitting: Certified Nurse Midwife

## 2020-11-18 ENCOUNTER — Other Ambulatory Visit: Payer: Self-pay

## 2020-11-18 ENCOUNTER — Ambulatory Visit (INDEPENDENT_AMBULATORY_CARE_PROVIDER_SITE_OTHER): Payer: No Typology Code available for payment source | Admitting: Certified Nurse Midwife

## 2020-11-18 DIAGNOSIS — Z3009 Encounter for other general counseling and advice on contraception: Secondary | ICD-10-CM

## 2020-11-18 DIAGNOSIS — Z30011 Encounter for initial prescription of contraceptive pills: Secondary | ICD-10-CM

## 2020-11-18 MED ORDER — NORETHIN ACE-ETH ESTRAD-FE 1.5-30 MG-MCG PO TABS: 1.0000 | ORAL_TABLET | Freq: Every day | ORAL | 3 refills | Status: AC

## 2020-11-18 NOTE — Patient Instructions (Signed)

## 2020-11-18 NOTE — Progress Notes (Signed)
Post Partum Visit Note  Madison Chambers is a 40 y.o. G37P2002 female who presents for a postpartum visit. She is 4 weeks postpartum following a normal spontaneous vaginal delivery.  I have fully reviewed the prenatal and intrapartum course. The delivery was at [redacted]w[redacted]d gestational weeks.  Anesthesia: none. Postpartum course has been uncomplicated. Baby is doing well. Baby is feeding by both breast and bottle - [place]so[place]. Bleeding no bleeding. Bowel function is normal. Bladder function is normal. Patient is not sexually active. Contraception method is OCP (estrogen/progesterone). Postpartum depression screening: negative.   The pregnancy intention screening data noted above was reviewed. Potential methods of contraception were discussed. The patient elected to proceed with Oral Contraceptive.    Edinburgh Postnatal Depression Scale - 11/18/20 0921      Edinburgh Postnatal Depression Scale:  In the Past 7 Days   I have been able to laugh and see the funny side of things. 0    I have looked forward with enjoyment to things. 0    I have blamed myself unnecessarily when things went wrong. 0    I have been anxious or worried for no good reason. 0    I have felt scared or panicky for no good reason. 0    Things have been getting on top of me. 0    I have been so unhappy that I have had difficulty sleeping. 0    I have felt sad or miserable. 0    I have been so unhappy that I have been crying. 0    The thought of harming myself has occurred to me. 0    Edinburgh Postnatal Depression Scale Total 0          The following portions of the patient's history were reviewed and updated as appropriate: allergies, current medications, past medical history, past social history and problem list.  Review of Systems A comprehensive review of systems was negative.    Objective:  There were no vitals taken for this visit.   General:  alert, cooperative and no distress   Breasts:  inspection negative,  no nipple discharge or bleeding, no masses or nodularity palpable  Lungs: clear to auscultation bilaterally  Heart:  regular rate and rhythm  Abdomen: soft, non-tender; bowel sounds normal; no masses,  no organomegaly   Vulva:  not evaluated  Vagina: not evaluated  Cervix:  not evaluated  Corpus: not examined  Adnexa:  not evaluated  Rectal Exam: Not performed.        Assessment:  1. Postpartum care and examination - Normal postpartum exam. Pap smear not done at today's visit.   2. Birth control counseling - educated and discussed birth control in detail with patient. Patient reports that she wants to be back on OCPs as she was in the past. Patient was on lo estrin for birth control  - educated and discussed with patient that OCPs in the postpartum period prior to 6 weeks can increase the risk of blood clots. Discussed with patient to wait until 6 week postpartum period is complete prior to starting medication, patient verbalizes understanding and will wait 2 more weeks.   3. Encounter for initial prescription of contraceptive pills - Rx sent to pharmacy of choice with start date of 4/7 - norethindrone-ethinyl estradiol-iron (LOESTRIN FE) 1.5-30 MG-MCG tablet; Take 1 tablet by mouth daily.  Dispense: 90 tablet; Refill: 3   Plan:   Essential components of care per ACOG recommendations:  1.  Mood and  well being: Patient with negative depression screening today. Reviewed local resources for support.  - Patient does not use tobacco. - hx of drug use? No    2. Infant care and feeding:  -Patient currently breastmilk feeding? Yes If breastmilk feeding discussed return to work and pumping. If needed, patient was provided letter for work to allow for every 2-3 hr pumping breaks, and to be granted a private location to express breastmilk and refrigerated area to store breastmilk. Reviewed importance of draining breast regularly to support lactation. -Social determinants of health (SDOH)  reviewed in EPIC. No concerns  3. Sexuality, contraception and birth spacing - Patient does not want a pregnancy in the next year.  - Reviewed forms of contraception in tiered fashion. Patient desired oral contraceptives (estrogen/progesterone) today.   - Discussed birth spacing of 18 months  4. Sleep and fatigue -Encouraged family/partner/community support of 4 hrs of uninterrupted sleep to help with mood and fatigue  5. Physical Recovery  - Discussed patients delivery and complications - Patient had a 2nd degree laceration, perineal healing reviewed. Patient expressed understanding - Patient has urinary incontinence? No  6.  Health Maintenance - Last pap smear done 02/06/2019 and was normal with negative HPV.   Sharyon Cable, CNM [facility] [employer], [facility]

## 2021-01-17 ENCOUNTER — Encounter: Payer: Self-pay | Admitting: *Deleted
